# Patient Record
Sex: Female | Born: 1975 | Race: White | Hispanic: No | Marital: Single | Smoking: Former smoker
Health system: Southern US, Community
[De-identification: ages and names within clinical notes are randomized; demographics above are authoritative.]

## PROBLEM LIST (undated history)

## (undated) DIAGNOSIS — E0590 Thyrotoxicosis, unspecified without thyrotoxic crisis or storm: Secondary | ICD-10-CM

## (undated) HISTORY — PX: THYROIDECTOMY: SHX17

## (undated) HISTORY — DX: Thyrotoxicosis, unspecified without thyrotoxic crisis or storm: E05.90

---

## 2005-01-03 ENCOUNTER — Inpatient Hospital Stay (HOSPITAL_COMMUNITY): Admission: AD | Admit: 2005-01-03 | Discharge: 2005-01-05 | Payer: Self-pay | Admitting: Obstetrics

## 2013-11-25 ENCOUNTER — Emergency Department (INDEPENDENT_AMBULATORY_CARE_PROVIDER_SITE_OTHER)
Admission: EM | Admit: 2013-11-25 | Discharge: 2013-11-25 | Disposition: A | Discharge status: Home / Self Care | Payer: Self-pay | Source: Home / Self Care

## 2013-11-25 ENCOUNTER — Encounter (HOSPITAL_COMMUNITY): Payer: Self-pay | Admitting: Emergency Medicine

## 2013-11-25 DIAGNOSIS — J020 Streptococcal pharyngitis: Secondary | ICD-10-CM

## 2013-11-25 MED ORDER — CEFDINIR 300 MG PO CAPS: 300.0000 mg | ORAL_CAPSULE | Freq: Two times a day (BID) | ORAL | Status: DC

## 2013-11-25 NOTE — ED Provider Notes (Signed)
CSN: [id number]     Arrival date & time 11/25/13  1537 History   None    Chief Complaint  Patient presents with  . Sore Throat   (Consider location/radiation/quality/duration/timing/severity/associated sxs/prior Treatment) Patient is a 37 y.o. female presenting with pharyngitis. The history is provided by the patient.  Sore Throat This is a new problem. The current episode started more than 2 days ago. The problem has been gradually worsening. Associated symptoms comments: Daughter with strep last week. The symptoms are aggravated by swallowing.    Past Medical History  Diagnosis Date  . Thyroid disease    Past Surgical History  Procedure Laterality Date  . Thyroidectomy     No family history on file. History  Substance Use Topics  . Smoking status: Current Every Day Smoker -- 0.50 packs/day    Types: Cigarettes  . Smokeless tobacco: Not on file  . Alcohol Use: No   OB History   Grav Para Term Preterm Abortions TAB SAB Ect Mult Living                 Review of Systems  Constitutional: Positive for fever, chills and appetite change.  HENT: Negative for sore throat.   Cardiovascular: Negative.   Gastrointestinal: Negative.     Allergies  Review of patient's allergies indicates no known allergies.  Home Medications   Current Outpatient Rx  Name  Route  Sig  Dispense  Refill  . levothyroxine (SYNTHROID, LEVOTHROID) 200 MCG tablet   Oral   Take 200 mcg by mouth daily before breakfast.         . cefdinir (OMNICEF) 300 MG capsule   Oral   Take 1 capsule (300 mg total) by mouth 2 (two) times daily.   20 capsule   0    BP 110/80  Pulse 99  Temp(Src) 98.2 F (36.8 C) (Oral)  Resp 16  SpO2 99%  LMP 11/21/2013 Physical Exam  Nursing note and vitals reviewed. Constitutional: She is oriented to person, place, and time. She appears well-developed and well-nourished. No distress.  HENT:  Right Ear: Tympanic membrane and ear canal normal. Tympanic membrane is  not erythematous and not retracted.  Left Ear: Tympanic membrane and ear canal normal. Tympanic membrane is not erythematous and not retracted.  Mouth/Throat: Uvula is midline and mucous membranes are normal. Oropharyngeal exudate, posterior oropharyngeal edema and posterior oropharyngeal erythema present.  Eyes: Conjunctivae are normal. Pupils are equal, round, and reactive to light.  Neck: Normal range of motion. Neck supple.  Pulmonary/Chest: Breath sounds normal.  Lymphadenopathy:    She has cervical adenopathy.  Neurological: She is alert and oriented to person, place, and time.  Skin: Skin is warm and dry.    ED Course  Procedures (including critical care time) Labs Review Labs Reviewed  POCT RAPID STREP A ([facility] ONLY) - Abnormal; Notable for the following:    Streptococcus, Group A Screen (Direct) POSITIVE (*)    All other components within normal limits   Imaging Review No results found.  EKG Interpretation    Date/Time:    Ventricular Rate:    PR Interval:    QRS Duration:   QT Interval:    QTC Calculation:   R Axis:     Text Interpretation:              MDM  Strep pos.    Linna Hoff, MD 11/25/13 [phone]

## 2013-11-25 NOTE — ED Notes (Signed)
Pt c/o ST onset 4 days w/sxs that include: difficulty swallowing, bilateral ear pain, fevers Denies: v/n/d, SOB, wheezing... Taking OTC cold meds w/no relief.  Alert wn/o signs of acute distress.

## 2015-06-22 ENCOUNTER — Emergency Department (HOSPITAL_COMMUNITY): Payer: Medicaid Other

## 2015-06-22 ENCOUNTER — Emergency Department (HOSPITAL_COMMUNITY)
Admission: EM | Admit: 2015-06-22 | Discharge: 2015-06-22 | Disposition: A | Discharge status: Home / Self Care | Payer: Medicaid Other | Attending: Emergency Medicine | Admitting: Emergency Medicine

## 2015-06-22 ENCOUNTER — Encounter (HOSPITAL_COMMUNITY): Payer: Self-pay | Admitting: Emergency Medicine

## 2015-06-22 DIAGNOSIS — E079 Disorder of thyroid, unspecified: Secondary | ICD-10-CM | POA: Diagnosis not present

## 2015-06-22 DIAGNOSIS — Z79899 Other long term (current) drug therapy: Secondary | ICD-10-CM | POA: Diagnosis not present

## 2015-06-22 DIAGNOSIS — Z720 Tobacco use: Secondary | ICD-10-CM | POA: Diagnosis not present

## 2015-06-22 DIAGNOSIS — M25572 Pain in left ankle and joints of left foot: Secondary | ICD-10-CM

## 2015-06-22 MED ORDER — IBUPROFEN 800 MG PO TABS: 800.0000 mg | ORAL_TABLET | Freq: Three times a day (TID) | ORAL | Status: DC

## 2015-06-22 MED ORDER — HYDROCODONE-ACETAMINOPHEN 5-325 MG PO TABS: 2.0000 | ORAL_TABLET | ORAL | Status: DC | PRN

## 2015-06-22 NOTE — ED Provider Notes (Signed)
CSN: [id number]     Arrival date & time 06/22/15  2118 History  This chart was scribed for Lori Berry, PA-C, working with Arby Barrette, MD by Chestine Spore, ED Scribe. The patient was seen in room TR11C/TR11C at 9:28 PM.    Chief Complaint  Patient presents with  . Ankle Pain      The history is provided by the patient. No language interpreter was used.    HPI Comments: Lori Hanson is a 39 y.o. female who presents to the Emergency Department complaining of worsening left ankle pain onset 1.5 months ago, after tripping on a slight elevation change in her friend's driveway, which caused her to roll her left ankle and fall on her face.  She was able to bare partial weight at the time of the incident, she states it was black and blue and swollen..   Pt notes that she has not been able to sleep at night due to the pain. Pt describes her left ankle pain as razor sharp pain, made worse with movement or baring weight.  She has been able to get around by putting her weight on her heel and inside edge of her foot.  Walking down stairs at here home greatly exacerbates her pain.  Pt initially thought that she sprained her ankle and that is why she didn't come in to be seen. . She states that she is having associated symptoms of left ankle swelling and bruising that has since resolved. She states that she has tried RICE with OTC tylenol with no relief for her symptoms. She denies left calf tenderness, left knee pain, left hip pain, and any other symptoms. Pt denies having an orthopedist that she sees. Pt PCP is Dr. Yetta Barre at [facility]. Pt denies allergies to medications.     Past Medical History  Diagnosis Date  . Thyroid disease    Past Surgical History  Procedure Laterality Date  . Thyroidectomy     No family history on file. History  Substance Use Topics  . Smoking status: Current Every Day Smoker -- 0.00 packs/day    Types: Cigarettes  . Smokeless tobacco: Not on file  . Alcohol  Use: No   OB History    No data available     Review of Systems  Musculoskeletal: Positive for joint swelling and arthralgias. Negative for gait problem.  Skin: Negative for color change, rash and wound.      Allergies  Review of patient's allergies indicates no known allergies.  Home Medications   Prior to Admission medications   Medication Sig Start Date End Date Taking? Authorizing Provider  cefdinir (OMNICEF) 300 MG capsule Take 1 capsule (300 mg total) by mouth 2 (two) times daily. 11/25/13   Linna Hoff, MD  levothyroxine (SYNTHROID, LEVOTHROID) 200 MCG tablet Take 200 mcg by mouth daily before breakfast.    Historical Provider, MD   BP 122/81 mmHg  Pulse 91  Temp(Src) 99.1 F (37.3 C) (Oral)  Resp 18  Wt 234 lb 12.8 oz (106.505 kg)  SpO2 98%  LMP 06/22/2015 Physical Exam  Constitutional: She is oriented to person, place, and time. She appears well-developed and well-nourished. No distress.  HENT:  Head: Normocephalic and atraumatic.  Right Ear: External ear normal.  Left Ear: External ear normal.  Nose: Nose normal.  Mouth/Throat: Oropharynx is clear and moist. No oropharyngeal exudate.  Eyes: Conjunctivae and EOM are normal. Pupils are equal, round, and reactive to light. Right eye exhibits no  discharge. Left eye exhibits no discharge. No scleral icterus.  Neck: Normal range of motion. Neck supple. No JVD present. No tracheal deviation present. No thyromegaly present.  Cardiovascular: Normal rate and regular rhythm.   Pulmonary/Chest: Effort normal and breath sounds normal. No stridor. No respiratory distress.  Musculoskeletal: Normal range of motion.  Left ankle lateral malleolus tender to palpation with swelling no edema no induration no ecchymosis Normal range of motion of left ankle, tenderness with left ankle flexion and eversion inversion, normal left knee range of motion, antalgic gait Neurovascularly intact bilateral lower extremities    Lymphadenopathy:    She has no cervical adenopathy.  Neurological: She is alert and oriented to person, place, and time. No cranial nerve deficit. She exhibits normal muscle tone. Coordination normal.  Skin: Skin is warm and dry. No rash noted. She is not diaphoretic. No erythema. No pallor.  Psychiatric: She has a normal mood and affect. Her behavior is normal. Judgment and thought content normal.  Nursing note and vitals reviewed.   ED Course  Procedures (including critical care time) DIAGNOSTIC STUDIES: Oxygen Saturation is 98% on RA, nl by my interpretation.    COORDINATION OF CARE: 9:35 PM-Discussed treatment plan with pt at bedside and pt agreed to plan.   Labs Review Labs Reviewed - No data to display  Imaging Review Dg Ankle Complete Left  06/22/2015   CLINICAL DATA:  Initial valuation for acute left ankle pain. Fall 1 month ago.  EXAM: LEFT ANKLE COMPLETE - 3+ VIEW  COMPARISON:  None.  FINDINGS: No acute fracture or dislocation. Talar dome intact. No joint effusion. Ankle mortise approximated. No acute soft tissue abnormality.  Small degenerative plantar calcaneal enthesophyte noted.  IMPRESSION: No acute abnormality identified about the left ankle.   Electronically Signed   By: Rise MuBenjamin  McClintock M.D.   On: 06/22/2015 22:01    EKG Interpretation None      MDM   Final diagnoses:  None    Pt with left ankle injury 1.5 months ago, with residual pain and swelling. X-ray negative for fx, visible swelling, bony tenderness - air splint applied, RICE tx, NSAID, and ortho f/u  I personally performed the services described in this documentation, which was scribed in my presence. The recorded information has been reviewed and is accurate.    Lori BerryLeisa Ashten Prats, PA-C 06/22/15 2320  Arby BarretteMarcy Pfeiffer, MD 06/24/15 2010

## 2015-06-22 NOTE — Discharge Instructions (Signed)

## 2015-06-22 NOTE — ED Notes (Signed)
Pt. fell 1 1/2 months ago and injured her left ankle , reports pain and swelling at left ankle , ambulatory.

## 2015-06-22 NOTE — Progress Notes (Signed)
Orthopedic Tech Progress Note Patient Details:  Lori Hanson 08-May-1976 [id number]  Ortho Devices Type of Ortho Device: Ankle Air splint, Crutches Ortho Device/Splint Location: LLE Ortho Device/Splint Interventions: Ordered, Application   Jennye MoccasinHughes, Deitra Craine Craig 06/22/2015, 10:44 PM

## 2015-07-13 ENCOUNTER — Telehealth: Payer: Self-pay | Admitting: *Deleted

## 2015-07-13 NOTE — Telephone Encounter (Signed)
Patient is interested in a possible IUD removal.  Attempted to contact the patient and left message for patient to contact the office.

## 2015-07-21 NOTE — Telephone Encounter (Signed)
Patient returned call to the office. Patient states okay to leave a message. Attempted to return the patient's call. Left message requesting patient to return call leaving information on what time of day or day of the week works for her as well when her IUD was placed and if she may want another. Will schedule appointment after receiving information.

## 2015-07-22 NOTE — Telephone Encounter (Signed)
Spoke with patient and scheduled her 07-27-15 @ 9:30 am for a NP/ IUD removal.

## 2015-07-27 ENCOUNTER — Encounter: Payer: Self-pay | Admitting: Certified Nurse Midwife

## 2015-07-27 ENCOUNTER — Ambulatory Visit (INDEPENDENT_AMBULATORY_CARE_PROVIDER_SITE_OTHER): Payer: Medicaid Other | Admitting: Certified Nurse Midwife

## 2015-07-27 VITALS — BP 128/85 | HR 82 | Temp 98.2°F | Wt 231.0 lb

## 2015-07-27 DIAGNOSIS — A630 Anogenital (venereal) warts: Secondary | ICD-10-CM | POA: Diagnosis not present

## 2015-07-27 DIAGNOSIS — Z3169 Encounter for other general counseling and advice on procreation: Secondary | ICD-10-CM | POA: Diagnosis not present

## 2015-07-27 MED ORDER — SINECATECHINS 15 % EX OINT: 1.0000 "application " | TOPICAL_OINTMENT | Freq: Two times a day (BID) | CUTANEOUS | Status: AC

## 2015-07-27 MED ORDER — VITAFOL ULTRA 29-0.6-0.4-200 MG PO CAPS: 1.0000 | ORAL_CAPSULE | Freq: Every day | ORAL | Status: DC

## 2015-07-27 NOTE — Progress Notes (Signed)
Patient ID: Lori Hanson, female   DOB: 12/16/1975, 39 y.o.   MRN: [id number]  No chief complaint on file.   HPI Lori Hanson is a 39 y.o. female.  Here for Mirena IUD removal, desires pregnancy.  Chronic medical condition: hypothyroidism had thyroid removed roughly 7 years ago, denies any radiation/chemo exposure.    HPI  Past Medical History  Diagnosis Date  . Thyroid disease     Past Surgical History  Procedure Laterality Date  . Thyroidectomy      No family history on file.  Social History Social History  Substance Use Topics  . Smoking status: Current Every Day Smoker -- 0.00 packs/day    Types: Cigarettes  . Smokeless tobacco: Not on file  . Alcohol Use: No    No Known Allergies  Current Outpatient Prescriptions  Medication Sig Dispense Refill  . cefdinir (OMNICEF) 300 MG capsule Take 1 capsule (300 mg total) by mouth 2 (two) times daily. 20 capsule 0  . HYDROcodone-acetaminophen (NORCO/VICODIN) 5-325 MG per tablet Take 2 tablets by mouth every 4 (four) hours as needed. 6 tablet 0  . ibuprofen (ADVIL,MOTRIN) 800 MG tablet Take 1 tablet (800 mg total) by mouth 3 (three) times daily. 21 tablet 0  . levothyroxine (SYNTHROID, LEVOTHROID) 200 MCG tablet Take 200 mcg by mouth daily before breakfast.    . Prenat-Fe Poly-Methfol-FA-DHA (VITAFOL ULTRA) 29-0.6-0.4-200 MG CAPS Take 1 tablet by mouth daily. 30 capsule 12  . Sinecatechins (VEREGEN) 15 % OINT Apply 1 application topically 2 (two) times daily. 30 g 0   No current facility-administered medications for this visit.    Review of Systems Review of Systems Constitutional: negative for fatigue and weight loss Respiratory: negative for cough and wheezing Cardiovascular: negative for chest pain, fatigue and palpitations Gastrointestinal: negative for abdominal pain and change in bowel habits Genitourinary:negative Integument/breast: negative for nipple discharge Musculoskeletal:negative for  myalgias Neurological: negative for gait problems and tremors Behavioral/Psych: negative for abusive relationship, depression Endocrine: negative for temperature intolerance     There were no vitals taken for this visit.  Physical Exam Physical Exam General:   alert  Skin:   no rash or abnormalities  Lungs:   clear to auscultation bilaterally  Heart:   regular rate and rhythm, S1, S2 normal, no murmur, click, rub or gallop  Breasts:   deferred  Abdomen:  normal findings: no organomegaly, soft, non-tender and no hernia  Pelvis:  External genitalia: normal general appearance, + genital warts on labia and rectum Urinary system: urethral meatus normal and bladder without fullness, nontender Vaginal: normal without tenderness, induration or masses Cervix: normal appearance Adnexa: normal bimanual exam Uterus: anteverted and non-tender, normal size    50% of 15 min visit spent on counseling and coordination of care.   Data Reviewed Previous medical hx, labs, meds  Assessment    IUD removed intact Genital warts  Preconception counseling  Plan    No orders of the defined types were placed in this encounter.   Meds ordered this encounter  Medications  . Sinecatechins (VEREGEN) 15 % OINT    Sig: Apply 1 application topically 2 (two) times daily.    Dispense:  30 g    Refill:  0  . Prenat-Fe Poly-Methfol-FA-DHA (VITAFOL ULTRA) 29-0.6-0.4-200 MG CAPS    Sig: Take 1 tablet by mouth daily.    Dispense:  30 capsule    Refill:  12     Follow up in a few weeks with annual exam.

## 2015-07-27 NOTE — Addendum Note (Signed)
Addended by: Clearnce Hasten on: 07/27/2015 11:46 AM   Modules accepted: Medications

## 2015-08-10 ENCOUNTER — Ambulatory Visit: Payer: Medicaid Other | Admitting: Certified Nurse Midwife

## 2015-11-16 ENCOUNTER — Encounter: Payer: Self-pay | Admitting: Obstetrics

## 2015-11-16 ENCOUNTER — Ambulatory Visit (INDEPENDENT_AMBULATORY_CARE_PROVIDER_SITE_OTHER): Payer: Medicaid Other | Admitting: Obstetrics

## 2015-11-16 VITALS — BP 113/77 | HR 97 | Temp 98.9°F | Ht 67.0 in | Wt 222.0 lb

## 2015-11-16 DIAGNOSIS — Z3482 Encounter for supervision of other normal pregnancy, second trimester: Secondary | ICD-10-CM | POA: Diagnosis not present

## 2015-11-16 DIAGNOSIS — Z3201 Encounter for pregnancy test, result positive: Secondary | ICD-10-CM

## 2015-11-16 DIAGNOSIS — O09522 Supervision of elderly multigravida, second trimester: Secondary | ICD-10-CM

## 2015-11-16 DIAGNOSIS — A630 Anogenital (venereal) warts: Secondary | ICD-10-CM | POA: Diagnosis not present

## 2015-11-16 LAB — POCT URINALYSIS DIPSTICK
BILIRUBIN UA: NEGATIVE
Blood, UA: NEGATIVE
Glucose, UA: NEGATIVE
LEUKOCYTES UA: NEGATIVE
NITRITE UA: NEGATIVE
PROTEIN UA: NEGATIVE
Spec Grav, UA: 1.015
Urobilinogen, UA: NEGATIVE
pH, UA: 5

## 2015-11-16 LAB — TSH: TSH: 1.771 u[IU]/mL (ref 0.350–4.500)

## 2015-11-16 LAB — POCT URINE PREGNANCY: Preg Test, Ur: POSITIVE — AB

## 2015-11-16 NOTE — Progress Notes (Signed)
Subjective:    Lori Hanson is being seen today for her first obstetrical visit.  This is a planned pregnancy. She is at 7233w3d gestation. Her obstetrical history is significant for advanced maternal age. Relationship with FOB: significant other, living together. Patient does intend to breast feed. Pregnancy history fully reviewed.  The information documented in the HPI was reviewed and verified.  Menstrual History: OB History    Gravida Para Term Preterm AB TAB SAB Ectopic Multiple Living   6 3 3  2  1   3        Patient's last menstrual period was 08/14/2015.    Past Medical History  Diagnosis Date  . Thyroid disease   . Hyperthyroidism     Past Surgical History  Procedure Laterality Date  . Thyroidectomy       (Not in a hospital admission) No Known Allergies  Social History  Substance Use Topics  . Smoking status: Former Smoker -- 0.00 packs/day    Types: Cigarettes  . Smokeless tobacco: Not on file  . Alcohol Use: No    Family History  Problem Relation Age of Onset  . Adopted: Yes     Review of Systems Constitutional: negative for weight loss Gastrointestinal: negative for vomiting Genitourinary:negative for genital lesions and vaginal discharge and dysuria Musculoskeletal:negative for back pain Behavioral/Psych: negative for abusive relationship, depression, illegal drug usage and tobacco use    Objective:    BP 113/77 mmHg  Pulse 97  Temp(Src) 98.9 F (37.2 C)  Ht 5\' 7"  (1.702 m)  Wt 222 lb (100.699 kg)  BMI 34.76 kg/m2  LMP 08/14/2015 General Appearance:    Alert, cooperative, no distress, appears stated age  Head:    Normocephalic, without obvious abnormality, atraumatic  Eyes:    PERRL, conjunctiva/corneas clear, EOM's intact, fundi    benign, both eyes  Ears:    Normal TM's and external ear canals, both ears  Nose:   Nares normal, septum midline, mucosa normal, no drainage    or sinus tenderness  Throat:   Lips, mucosa, and tongue normal;  teeth and gums normal  Neck:   Supple, symmetrical, trachea midline, no adenopathy;    thyroid:  no enlargement/tenderness/nodules; no carotid   bruit or JVD  Back:     Symmetric, no curvature, ROM normal, no CVA tenderness  Lungs:     Clear to auscultation bilaterally, respirations unlabored  Chest Wall:    No tenderness or deformity   Heart:    Regular rate and rhythm, S1 and S2 normal, no murmur, rub   or gallop  Breast Exam:    No tenderness, masses, or nipple abnormality  Abdomen:     Soft, non-tender, bowel sounds active all four quadrants,    no masses, no organomegaly  Genitalia:    Perianal and perineal condyloma; otherwise, normal female without lesion, discharge or tenderness  Extremities:   Extremities normal, atraumatic, no cyanosis or edema  Pulses:   2+ and symmetric all extremities  Skin:   Skin color, texture, turgor normal, no rashes or lesions  Lymph nodes:   Cervical, supraclavicular, and axillary nodes normal  Neurologic:   CNII-XII intact, normal strength, sensation and reflexes    throughout      Lab Review Urine pregnancy test Labs reviewed no Radiologic studies reviewed no Assessment:    Pregnancy at 3733w3d weeks    Plan:     Referred to MFM for AMA.  Prenatal vitamins.  Counseling provided regarding continued use of  seat belts, cessation of alcohol consumption, smoking or use of illicit drugs; infection precautions i.e., influenza/TDAP immunizations, toxoplasmosis,CMV, parvovirus, listeria and varicella; workplace safety, exercise during pregnancy; routine dental care, safe medications, sexual activity, hot tubs, saunas, pools, travel, caffeine use, fish and methlymercury, potential toxins, hair treatments, varicose veins Weight gain recommendations per IOM guidelines reviewed: underweight/BMI< 18.5--> gain 28 - 40 lbs; normal weight/BMI 18.5 - 24.9--> gain 25 - 35 lbs; overweight/BMI 25 - 29.9--> gain 15 - 25 lbs; obese/BMI >30->gain  11 - 20 lbs Problem  list reviewed and updated. FIRST/CF mutation testing/NIPT/QUAD SCREEN/fragile X/Ashkenazi Jewish population testing/Spinal muscular atrophy discussed: requested. Role of ultrasound in pregnancy discussed; fetal survey: requested. Amniocentesis discussed: undecided. VBAC calculator score: VBAC consent form provided No orders of the defined types were placed in this encounter.   Orders Placed This Encounter  Procedures  . Culture, OB Urine  . SureSwab, Vaginosis/Vaginitis Plus  . [place] MFM Fetal Nuchal Translucency    Standing Status: Future     Number of Occurrences:      Standing Expiration Date: 01/16/2017    Order Specific Question:  Reason for Exam (SYMPTOM  OR DIAGNOSIS REQUIRED)    Answer:  AMA    Order Specific Question:  Preferred imaging location?    Answer:  [facility]    Order Specific Question:  Call Results- Best Contact Number?    Answer:  AMA  . Obstetric panel  . HIV antibody  . Varicella zoster antibody, IgG  . VITAMIN D 25 Hydroxy (Vit-D Deficiency, Fractures)  . TSH  . AMB Referral to Maternal Fetal Medicine (MFM)    Referral Priority:  Routine    Referral Type:  Consultation    Number of Visits Requested:  1  . POCT urinalysis dipstick  . POCT urine pregnancy    Follow up in 4 weeks.

## 2015-11-17 LAB — OBSTETRIC PANEL
Antibody Screen: NEGATIVE
Basophils Absolute: 0 10*3/uL (ref 0.0–0.1)
Basophils Relative: 0 % (ref 0–1)
EOS PCT: 2 % (ref 0–5)
Eosinophils Absolute: 0.2 10*3/uL (ref 0.0–0.7)
HCT: 40.9 % (ref 36.0–46.0)
HEP B S AG: NEGATIVE
Hemoglobin: 13.8 g/dL (ref 12.0–15.0)
LYMPHS PCT: 28 % (ref 12–46)
Lymphs Abs: 2.7 10*3/uL (ref 0.7–4.0)
MCH: 32.3 pg (ref 26.0–34.0)
MCHC: 33.7 g/dL (ref 30.0–36.0)
MCV: 93.4 fL (ref 78.0–100.0)
MONO ABS: 0.5 10*3/uL (ref 0.1–1.0)
MPV: 9.5 fL (ref 8.6–12.4)
Monocytes Relative: 5 % (ref 3–12)
Neutro Abs: 6.3 10*3/uL (ref 1.7–7.7)
Neutrophils Relative %: 65 % (ref 43–77)
PLATELETS: 223 10*3/uL (ref 150–400)
RBC: 4.27 MIL/uL (ref 3.87–5.11)
RDW: 13 % (ref 11.5–15.5)
RH TYPE: POSITIVE
Rubella: 2.53 Index — ABNORMAL HIGH (ref <0.90)
WBC: 9.7 10*3/uL (ref 4.0–10.5)

## 2015-11-17 LAB — CULTURE, OB URINE
Colony Count: NO GROWTH
Organism ID, Bacteria: NO GROWTH

## 2015-11-17 LAB — VITAMIN D 25 HYDROXY (VIT D DEFICIENCY, FRACTURES): Vit D, 25-Hydroxy: 13 ng/mL — ABNORMAL LOW (ref 30–100)

## 2015-11-17 LAB — HIV ANTIBODY (ROUTINE TESTING W REFLEX): HIV: NONREACTIVE

## 2015-11-17 LAB — VARICELLA ZOSTER ANTIBODY, IGG: Varicella IgG: 1831 Index — ABNORMAL HIGH (ref <135.00)

## 2015-11-18 ENCOUNTER — Ambulatory Visit (HOSPITAL_COMMUNITY): Admission: RE | Admit: 2015-11-18 | Payer: Medicaid Other | Source: Ambulatory Visit

## 2015-11-18 ENCOUNTER — Encounter (HOSPITAL_COMMUNITY): Payer: Self-pay

## 2015-11-18 ENCOUNTER — Ambulatory Visit (HOSPITAL_COMMUNITY)
Admission: RE | Admit: 2015-11-18 | Discharge: 2015-11-18 | Disposition: A | Discharge status: Home / Self Care | Payer: Medicaid Other | Source: Ambulatory Visit | Attending: Obstetrics | Admitting: Obstetrics

## 2015-11-18 DIAGNOSIS — Z36 Encounter for antenatal screening of mother: Secondary | ICD-10-CM | POA: Diagnosis not present

## 2015-11-18 DIAGNOSIS — O09522 Supervision of elderly multigravida, second trimester: Secondary | ICD-10-CM

## 2015-11-18 DIAGNOSIS — O09521 Supervision of elderly multigravida, first trimester: Secondary | ICD-10-CM | POA: Diagnosis present

## 2015-11-18 DIAGNOSIS — Z3A13 13 weeks gestation of pregnancy: Secondary | ICD-10-CM | POA: Diagnosis not present

## 2015-11-18 LAB — PAP, TP IMAGING W/ HPV RNA, RFLX HPV TYPE 16,18/45: HPV mRNA, High Risk: NOT DETECTED

## 2015-11-20 LAB — SURESWAB, VAGINOSIS/VAGINITIS PLUS
ATOPOBIUM VAGINAE: 7 Log (cells/mL)
C. PARAPSILOSIS, DNA: DETECTED — AB
C. albicans, DNA: NOT DETECTED
C. glabrata, DNA: NOT DETECTED
C. trachomatis RNA, TMA: NOT DETECTED
C. tropicalis, DNA: NOT DETECTED
Gardnerella vaginalis: >8 Log (cells/mL)
LACTOBACILLUS SPECIES: NOT DETECTED Log (cells/mL)
MEGASPHAERA SPECIES: 7.6 Log (cells/mL)
N. GONORRHOEAE RNA, TMA: NOT DETECTED
T. vaginalis RNA, QL TMA: NOT DETECTED

## 2015-11-21 ENCOUNTER — Other Ambulatory Visit: Payer: Self-pay | Admitting: Obstetrics

## 2015-11-21 DIAGNOSIS — B3731 Acute candidiasis of vulva and vagina: Secondary | ICD-10-CM

## 2015-11-21 DIAGNOSIS — B9689 Other specified bacterial agents as the cause of diseases classified elsewhere: Secondary | ICD-10-CM

## 2015-11-21 DIAGNOSIS — N760 Acute vaginitis: Principal | ICD-10-CM

## 2015-11-21 DIAGNOSIS — B373 Candidiasis of vulva and vagina: Secondary | ICD-10-CM

## 2015-11-21 MED ORDER — METRONIDAZOLE 500 MG PO TABS: 500.0000 mg | ORAL_TABLET | Freq: Two times a day (BID) | ORAL | Status: DC

## 2015-11-21 MED ORDER — FLUCONAZOLE 150 MG PO TABS: 150.0000 mg | ORAL_TABLET | Freq: Once | ORAL | Status: DC

## 2015-12-12 ENCOUNTER — Encounter: Payer: Medicaid Other | Admitting: Obstetrics

## 2015-12-12 ENCOUNTER — Telehealth: Payer: Self-pay | Admitting: Obstetrics

## 2015-12-15 ENCOUNTER — Ambulatory Visit (INDEPENDENT_AMBULATORY_CARE_PROVIDER_SITE_OTHER): Payer: Medicaid Other | Admitting: Obstetrics

## 2015-12-15 ENCOUNTER — Encounter: Payer: Self-pay | Admitting: Obstetrics

## 2015-12-15 VITALS — BP 114/77 | HR 110 | Wt 220.0 lb

## 2015-12-15 DIAGNOSIS — Z3482 Encounter for supervision of other normal pregnancy, second trimester: Secondary | ICD-10-CM

## 2015-12-15 DIAGNOSIS — O09522 Supervision of elderly multigravida, second trimester: Secondary | ICD-10-CM

## 2015-12-15 LAB — POCT URINALYSIS DIPSTICK
Bilirubin, UA: NEGATIVE
Blood, UA: NEGATIVE
Glucose, UA: NEGATIVE
KETONES UA: NEGATIVE
LEUKOCYTES UA: NEGATIVE
Nitrite, UA: NEGATIVE
PROTEIN UA: NEGATIVE
SPEC GRAV UA: 1.025
Urobilinogen, UA: NEGATIVE
pH, UA: 5

## 2015-12-15 NOTE — Progress Notes (Signed)
  Subjective:    Lori Hanson is a 40 y.o. female being seen today for her obstetrical visit. She is at 377w4d gestation. Patient reports: no complaints.  Problem List Items Addressed This Visit    None    Visit Diagnoses    Encounter for supervision of other normal pregnancy in second trimester    -  Primary    Relevant Orders    POCT urinalysis dipstick (Completed)      There are no active problems to display for this patient.   Objective:     BP 114/77 mmHg  Pulse 110  Wt 220 lb (99.791 kg)  LMP 08/14/2015 Uterine Size: Below umbilicus     Assessment:    Pregnancy @ 527w4d  weeks Doing well    Plan:    Problem list reviewed and updated. Labs reviewed.  Follow up in 4 weeks. FIRST/CF mutation testing/NIPT/QUAD SCREEN/fragile X/Ashkenazi Jewish population testing/Spinal muscular atrophy discussed: requested. Role of ultrasound in pregnancy discussed; fetal survey: requested. Amniocentesis discussed: not indicated.

## 2015-12-21 ENCOUNTER — Other Ambulatory Visit: Payer: Self-pay | Admitting: Certified Nurse Midwife

## 2015-12-26 ENCOUNTER — Encounter (HOSPITAL_COMMUNITY): Payer: Self-pay

## 2015-12-26 ENCOUNTER — Ambulatory Visit (HOSPITAL_COMMUNITY)
Admission: RE | Admit: 2015-12-26 | Discharge: 2015-12-26 | Disposition: A | Discharge status: Home / Self Care | Payer: Medicaid Other | Source: Ambulatory Visit | Attending: Obstetrics | Admitting: Obstetrics

## 2015-12-26 DIAGNOSIS — Z36 Encounter for antenatal screening of mother: Secondary | ICD-10-CM | POA: Insufficient documentation

## 2015-12-26 DIAGNOSIS — E0590 Thyrotoxicosis, unspecified without thyrotoxic crisis or storm: Secondary | ICD-10-CM | POA: Diagnosis not present

## 2015-12-26 DIAGNOSIS — O99282 Endocrine, nutritional and metabolic diseases complicating pregnancy, second trimester: Secondary | ICD-10-CM | POA: Diagnosis not present

## 2015-12-26 DIAGNOSIS — O09522 Supervision of elderly multigravida, second trimester: Secondary | ICD-10-CM | POA: Insufficient documentation

## 2015-12-26 DIAGNOSIS — O99212 Obesity complicating pregnancy, second trimester: Secondary | ICD-10-CM | POA: Insufficient documentation

## 2015-12-26 DIAGNOSIS — Z3A19 19 weeks gestation of pregnancy: Secondary | ICD-10-CM | POA: Diagnosis not present

## 2015-12-27 NOTE — Telephone Encounter (Signed)
Patient appt rescheduled.

## 2016-01-16 ENCOUNTER — Ambulatory Visit (INDEPENDENT_AMBULATORY_CARE_PROVIDER_SITE_OTHER): Payer: Medicaid Other | Admitting: Obstetrics

## 2016-01-16 VITALS — BP 120/73 | HR 96 | Temp 99.0°F | Wt 217.6 lb

## 2016-01-16 DIAGNOSIS — K219 Gastro-esophageal reflux disease without esophagitis: Secondary | ICD-10-CM

## 2016-01-16 DIAGNOSIS — Z3492 Encounter for supervision of normal pregnancy, unspecified, second trimester: Secondary | ICD-10-CM

## 2016-01-16 LAB — POCT URINALYSIS DIPSTICK
Bilirubin, UA: NEGATIVE
Blood, UA: NEGATIVE
Glucose, UA: NEGATIVE
KETONES UA: 2
LEUKOCYTES UA: NEGATIVE
Nitrite, UA: NEGATIVE
PH UA: 6
Spec Grav, UA: 1.015
UROBILINOGEN UA: NEGATIVE

## 2016-01-16 MED ORDER — OMEPRAZOLE 20 MG PO CPDR: 20.0000 mg | DELAYED_RELEASE_CAPSULE | Freq: Two times a day (BID) | ORAL | Status: DC

## 2016-01-16 NOTE — Progress Notes (Signed)
Patient reports she is doing well with no concerns today. 

## 2016-01-18 ENCOUNTER — Encounter: Payer: Self-pay | Admitting: Obstetrics

## 2016-01-18 NOTE — Progress Notes (Signed)
Subjective:    Lori Hanson is a 40 y.o. female being seen today for her obstetrical visit. She is at [redacted]w[redacted]d gestation. Patient reports: heartburn . Fetal movement: normal.  Problem List Items Addressed This Visit    None    Visit Diagnoses    Prenatal care, second trimester    -  Primary    Relevant Orders    POCT urinalysis dipstick (Completed)    GERD without esophagitis        Relevant Medications    omeprazole (PRILOSEC) 20 MG capsule      There are no active problems to display for this patient.  Objective:    BP 120/73 mmHg  Pulse 96  Temp(Src) 99 F (37.2 C)  Wt 217 lb 9.6 oz (98.703 kg)  LMP 08/14/2015 FHT: 150 BPM  Uterine Size: size equals dates     Assessment:    Pregnancy @ [redacted]w[redacted]d     Heartburn  Plan:    Signs and symptoms of preterm labor: discussed. Zantac Rx Labs, problem list reviewed and updated 2 hr GTT planned Follow up in 4 weeks.

## 2016-02-13 ENCOUNTER — Ambulatory Visit (INDEPENDENT_AMBULATORY_CARE_PROVIDER_SITE_OTHER): Payer: Medicaid Other | Admitting: Obstetrics

## 2016-02-13 VITALS — BP 108/77 | HR 112 | Temp 98.4°F | Wt 219.0 lb

## 2016-02-13 DIAGNOSIS — Z3483 Encounter for supervision of other normal pregnancy, third trimester: Secondary | ICD-10-CM

## 2016-02-13 LAB — POCT URINALYSIS DIPSTICK
Bilirubin, UA: NEGATIVE
Blood, UA: NEGATIVE
Glucose, UA: NEGATIVE
Leukocytes, UA: NEGATIVE
Nitrite, UA: NEGATIVE
PROTEIN UA: NEGATIVE
SPEC GRAV UA: 1.015
UROBILINOGEN UA: NEGATIVE
pH, UA: 5

## 2016-02-14 ENCOUNTER — Encounter: Payer: Self-pay | Admitting: Obstetrics

## 2016-02-14 NOTE — Progress Notes (Signed)
Subjective:    Lori Hanson is a 40 y.o. female being seen today for her obstetrical visit. She is at 5692w2d gestation. Patient reports: no complaints . Fetal movement: normal.  Problem List Items Addressed This Visit    None    Visit Diagnoses    Supervision of other normal pregnancy, antepartum, second trimester    -  Primary    Relevant Orders    POCT urinalysis dipstick (Completed)      There are no active problems to display for this patient.  Objective:    BP 108/77 mmHg  Pulse 112  Temp(Src) 98.4 F (36.9 C)  Wt 219 lb (99.338 kg)  LMP 08/14/2015 FHT: 150 BPM  Uterine Size: size equals dates     Assessment:    Pregnancy @ 192w2d    Plan:    OBGCT: ordered. Signs and symptoms of preterm labor: discussed.  Labs, problem list reviewed and updated 2 hr GTT planned Follow up in 2 weeks.

## 2016-02-27 ENCOUNTER — Ambulatory Visit (INDEPENDENT_AMBULATORY_CARE_PROVIDER_SITE_OTHER): Payer: Medicaid Other | Admitting: Obstetrics

## 2016-02-27 ENCOUNTER — Encounter (HOSPITAL_COMMUNITY): Payer: Self-pay | Admitting: *Deleted

## 2016-02-27 ENCOUNTER — Other Ambulatory Visit: Payer: Self-pay | Admitting: Certified Nurse Midwife

## 2016-02-27 ENCOUNTER — Other Ambulatory Visit: Payer: Medicaid Other

## 2016-02-27 ENCOUNTER — Inpatient Hospital Stay (HOSPITAL_COMMUNITY)
Admission: AD | Admit: 2016-02-27 | Discharge: 2016-02-27 | Disposition: A | Discharge status: Home / Self Care | Payer: Medicaid Other | Source: Ambulatory Visit | Attending: Obstetrics | Admitting: Obstetrics

## 2016-02-27 VITALS — BP 117/79 | HR 101 | Temp 98.0°F | Wt 217.6 lb

## 2016-02-27 DIAGNOSIS — R109 Unspecified abdominal pain: Secondary | ICD-10-CM | POA: Insufficient documentation

## 2016-02-27 DIAGNOSIS — O99283 Endocrine, nutritional and metabolic diseases complicating pregnancy, third trimester: Secondary | ICD-10-CM | POA: Diagnosis not present

## 2016-02-27 DIAGNOSIS — O26893 Other specified pregnancy related conditions, third trimester: Secondary | ICD-10-CM | POA: Insufficient documentation

## 2016-02-27 DIAGNOSIS — Z87891 Personal history of nicotine dependence: Secondary | ICD-10-CM | POA: Diagnosis not present

## 2016-02-27 DIAGNOSIS — Z3A28 28 weeks gestation of pregnancy: Secondary | ICD-10-CM | POA: Diagnosis not present

## 2016-02-27 DIAGNOSIS — E860 Dehydration: Secondary | ICD-10-CM

## 2016-02-27 DIAGNOSIS — O9989 Other specified diseases and conditions complicating pregnancy, childbirth and the puerperium: Secondary | ICD-10-CM | POA: Diagnosis not present

## 2016-02-27 DIAGNOSIS — Z79899 Other long term (current) drug therapy: Secondary | ICD-10-CM | POA: Insufficient documentation

## 2016-02-27 DIAGNOSIS — Z3493 Encounter for supervision of normal pregnancy, unspecified, third trimester: Secondary | ICD-10-CM

## 2016-02-27 DIAGNOSIS — E890 Postprocedural hypothyroidism: Secondary | ICD-10-CM | POA: Insufficient documentation

## 2016-02-27 DIAGNOSIS — O26899 Other specified pregnancy related conditions, unspecified trimester: Secondary | ICD-10-CM

## 2016-02-27 LAB — POCT URINALYSIS DIPSTICK
Bilirubin, UA: NEGATIVE
GLUCOSE UA: NEGATIVE
KETONES UA: 2
Nitrite, UA: NEGATIVE
Protein, UA: 2
RBC UA: NEGATIVE
SPEC GRAV UA: 1.025
Urobilinogen, UA: NEGATIVE
pH, UA: 6

## 2016-02-27 LAB — URINALYSIS, ROUTINE W REFLEX MICROSCOPIC
Bilirubin Urine: NEGATIVE
GLUCOSE, UA: NEGATIVE mg/dL
Hgb urine dipstick: NEGATIVE
Ketones, ur: 40 mg/dL — AB
Leukocytes, UA: NEGATIVE
Nitrite: NEGATIVE
Protein, ur: NEGATIVE mg/dL
SPECIFIC GRAVITY, URINE: 1.02 (ref 1.005–1.030)
pH: 6 (ref 5.0–8.0)

## 2016-02-27 MED ORDER — DEXTROSE 5 % IN LACTATED RINGERS IV BOLUS
1000.0000 mL | Freq: Once | INTRAVENOUS | Status: AC
  Administered 2016-02-27: 1000 mL via INTRAVENOUS

## 2016-02-27 NOTE — MAU Note (Signed)
Sent from OB's office for IV fluids; states that she is dehydrated; having abdominal pain but ucs were ruled out at the OB's office;

## 2016-02-27 NOTE — Discharge Instructions (Signed)

## 2016-02-27 NOTE — MAU Provider Note (Signed)
History     CSN: [id number]  Arrival date and time: 02/27/16 1201   First Provider Initiated Contact with Patient 02/27/16 1247       Chief Complaint  Patient presents with  . Abdominal Pain  . Dehydration   HPI Lori Hanson is a 40 y.o. [id number] at 3763w1d who presents sent from office for IV fluids. Went to office today for glucose tolerance test; told nurse that she was having abdominal cramping so they did NST. No contractions on the monitor but told patient she needed to come to MAU for IV fluids d/t dehydration. Patient states abdominal cramping has been ongoing for several months & hasn't really changed today. Denies contractions, vaginal bleeding, or LOF. Positive fetal movement. Pt states she's only had 2 cups of water today.   OB History    Gravida Para Term Preterm AB TAB SAB Ectopic Multiple Living   6 3 3  2  1   3       Past Medical History  Diagnosis Date  . Hyperthyroidism     Past Surgical History  Procedure Laterality Date  . Thyroidectomy      Family History  Problem Relation Age of Onset  . Adopted: Yes  . Family history unknown: Yes    Social History  Substance Use Topics  . Smoking status: Former Smoker -- 0.00 packs/day    Types: Cigarettes  . Smokeless tobacco: None  . Alcohol Use: No    Allergies: No Known Allergies  Prescriptions prior to admission  Medication Sig Dispense Refill Last Dose  . Iron-Vitamins (VITAFOL PO) Take by mouth.   Taking  . levothyroxine (SYNTHROID, LEVOTHROID) 150 MCG tablet Take 125 mcg by mouth daily.  5   . omeprazole (PRILOSEC) 20 MG capsule   5     Review of Systems  Constitutional: Negative.   Gastrointestinal: Positive for abdominal pain. Negative for nausea, vomiting, diarrhea and constipation.  Genitourinary: Negative.    Physical Exam   Blood pressure 106/61, pulse 102, temperature 97.9 F (36.6 C), temperature source Oral, resp. rate 18, height 5\' 7"  (1.702 m), last menstrual period  08/14/2015.  Physical Exam  Nursing note and vitals reviewed. Constitutional: She is oriented to person, place, and time. She appears well-developed and well-nourished. No distress.  HENT:  Head: Normocephalic and atraumatic.  Eyes: Conjunctivae are normal. Right eye exhibits no discharge. Left eye exhibits no discharge. No scleral icterus.  Neck: Normal range of motion.  Cardiovascular: Normal rate, regular rhythm and normal heart sounds.   No murmur heard. Respiratory: Effort normal and breath sounds normal. No respiratory distress. She has no wheezes.  GI: Soft. Bowel sounds are normal. There is no tenderness.  Neurological: She is alert and oriented to person, place, and time.  Skin: Skin is warm and dry. She is not diaphoretic.  Psychiatric: She has a normal mood and affect. Her behavior is normal. Judgment and thought content normal.   Fetal Tracing:  Baseline: 150 Variability: moderate Accelerations: 10x10 Decelerations: none  Toco: none  MAU Course  Procedures Results for orders placed or performed during the hospital encounter of 02/27/16 (from the past 24 hour(s))  Urinalysis, Routine w reflex microscopic (not at [facility])     Status: Abnormal   Collection Time: 02/27/16 12:30 PM  Result Value Ref Range   Color, Urine YELLOW YELLOW   APPearance CLEAR CLEAR   Specific Gravity, Urine 1.020 1.005 - 1.030   pH 6.0 5.0 - 8.0   Glucose, UA  NEGATIVE NEGATIVE mg/dL   Hgb urine dipstick NEGATIVE NEGATIVE   Bilirubin Urine NEGATIVE NEGATIVE   Ketones, ur 40 (A) NEGATIVE mg/dL   Protein, ur NEGATIVE NEGATIVE mg/dL   Nitrite NEGATIVE NEGATIVE   Leukocytes, UA NEGATIVE NEGATIVE    MDM Category 1 tracing, no contractions.  Pt states she's "impatiently waiting" for the IV fluids & doesn't understand what she's waiting for or why she needs fetal monitoring again. Patient taken off monitor. Declines cervical exam b/c she states she "doesn't need it".  S/w Dr. Clearance Coots regarding  patient. Ok for discharge after bag of fluids.   Assessment and Plan  A: 1. Dehydration   2. Abdominal cramping affecting pregnancy     P: Discharge home Increase PO fluid intake Discussed reasons to return Keep f/u with Dr. Inge Rise 02/27/2016, 12:35 PM

## 2016-02-27 NOTE — MAU Note (Signed)
Patient sent from office at [redacted] weeks gestation with c/o abdominal cramping. Fetus active. Denies bleeding or discharge.

## 2016-02-27 NOTE — Progress Notes (Signed)
Patient is having "cramping" that is irregular- down low. Feels painful.

## 2016-03-01 ENCOUNTER — Encounter: Payer: Self-pay | Admitting: Obstetrics

## 2016-03-01 NOTE — Progress Notes (Signed)
Subjective:    Lori Hanson is a 40 y.o. female being seen today for her obstetrical visit. She is at 266w4d gestation. Patient reports no complaints. Fetal movement: normal.  Problem List Items Addressed This Visit    None    Visit Diagnoses    Prenatal care, second trimester    -  Primary    Relevant Orders    POCT urinalysis dipstick (Completed)      There are no active problems to display for this patient.  Objective:    BP 117/79 mmHg  Pulse 101  Temp(Src) 98 F (36.7 C)  Wt 217 lb 9.6 oz (98.703 kg)  LMP 08/14/2015 FHT:  150 BPM  Uterine Size: size equals dates  Presentation: unsure     Assessment:    Pregnancy @ 2666w4d weeks   Plan:     labs reviewed, problem list updated Consent signed. GBS sent TDAP offered  Rhogam given for RH negative Pediatrician: discussed. Infant feeding: plans to breastfeed. Maternity leave: discussed. Cigarette smoking: FORMER SMOKER. Orders Placed This Encounter  Procedures  . POCT urinalysis dipstick   Meds ordered this encounter  Medications  . levothyroxine (SYNTHROID, LEVOTHROID) 150 MCG tablet    Sig: Take 125 mcg by mouth daily.    Refill:  5  . Iron-Vitamins (VITAFOL PO)    Sig: Take by mouth.   Follow up in 2 Weeks.

## 2016-03-12 ENCOUNTER — Ambulatory Visit (INDEPENDENT_AMBULATORY_CARE_PROVIDER_SITE_OTHER): Payer: Medicaid Other | Admitting: Obstetrics

## 2016-03-12 ENCOUNTER — Encounter: Payer: Self-pay | Admitting: Obstetrics

## 2016-03-12 VITALS — BP 117/79 | HR 106 | Temp 98.9°F | Wt 220.8 lb

## 2016-03-12 DIAGNOSIS — O3663X1 Maternal care for excessive fetal growth, third trimester, fetus 1: Secondary | ICD-10-CM

## 2016-03-12 DIAGNOSIS — Z3493 Encounter for supervision of normal pregnancy, unspecified, third trimester: Secondary | ICD-10-CM

## 2016-03-12 LAB — POCT URINALYSIS DIPSTICK
Bilirubin, UA: NEGATIVE
Blood, UA: NEGATIVE
GLUCOSE UA: NEGATIVE
Ketones, UA: NEGATIVE
Leukocytes, UA: NEGATIVE
NITRITE UA: NEGATIVE
Spec Grav, UA: <=1.005
Urobilinogen, UA: NEGATIVE
pH, UA: 6

## 2016-03-12 NOTE — Progress Notes (Signed)
Patient is doing better- she is noticing some swelling in her feet.

## 2016-03-12 NOTE — Progress Notes (Signed)
Subjective:    Lori Hanson is a 40 y.o. female being seen today for her obstetrical visit. She is at 3594w1d gestation. Patient reports no complaints. Fetal movement: normal.  Problem List Items Addressed This Visit    None    Visit Diagnoses    Prenatal care, third trimester    -  Primary    Relevant Orders    POCT urinalysis dipstick (Completed)    LGA (large for gestational age) fetus affecting management of mother, third trimester, fetus 1        Relevant Orders    US MFM OB COMP + 14 WK      There are no active problems to display for this patient.  Objective:    BP 117/79 mmHg  Pulse 106  Temp(Src) 98.9 F (37.2 C)  Wt 220 lb 12.8 oz (100.154 kg)  LMP 08/14/2015 FHT:  150 BPM  Uterine Size: size greater than dates  Presentation: unsure     Assessment:    Pregnancy @ 1294w1d weeks    Suspect LGA fetus  Plan:   Ultrasound ordered for growth   labs reviewed, problem list updated Consent signed. GBS sent TDAP offered  Rhogam given for RH negative Pediatrician: discussed. Infant feeding: plans to breastfeed. Maternity leave: discussed. Cigarette smoking: former smoker.  Orders Placed This Encounter  Procedures  . US MFM OB COMP + 14 WK    Standing Status: Future     Number of Occurrences:      Standing Expiration Date: 05/12/2017    Order Specific Question:  Reason for Exam (SYMPTOM  OR DIAGNOSIS REQUIRED)    Answer:  LGA    Order Specific Question:  Preferred imaging location?    Answer:  [facility]  . POCT urinalysis dipstick   No orders of the defined types were placed in this encounter.   Follow up in 2 Weeks.

## 2016-03-16 ENCOUNTER — Encounter (HOSPITAL_COMMUNITY): Payer: Self-pay

## 2016-03-16 ENCOUNTER — Other Ambulatory Visit: Payer: Self-pay | Admitting: Obstetrics

## 2016-03-16 ENCOUNTER — Ambulatory Visit (HOSPITAL_COMMUNITY)
Admission: RE | Admit: 2016-03-16 | Discharge: 2016-03-16 | Disposition: A | Discharge status: Home / Self Care | Payer: Medicaid Other | Source: Ambulatory Visit | Attending: Obstetrics | Admitting: Obstetrics

## 2016-03-16 DIAGNOSIS — O3663X1 Maternal care for excessive fetal growth, third trimester, fetus 1: Secondary | ICD-10-CM

## 2016-03-16 DIAGNOSIS — Z3A30 30 weeks gestation of pregnancy: Secondary | ICD-10-CM

## 2016-03-16 DIAGNOSIS — O99213 Obesity complicating pregnancy, third trimester: Secondary | ICD-10-CM | POA: Diagnosis not present

## 2016-03-16 DIAGNOSIS — E0590 Thyrotoxicosis, unspecified without thyrotoxic crisis or storm: Secondary | ICD-10-CM | POA: Insufficient documentation

## 2016-03-16 DIAGNOSIS — O26843 Uterine size-date discrepancy, third trimester: Secondary | ICD-10-CM | POA: Insufficient documentation

## 2016-03-16 DIAGNOSIS — O99283 Endocrine, nutritional and metabolic diseases complicating pregnancy, third trimester: Secondary | ICD-10-CM | POA: Diagnosis not present

## 2016-03-16 DIAGNOSIS — O09523 Supervision of elderly multigravida, third trimester: Secondary | ICD-10-CM | POA: Insufficient documentation

## 2016-03-16 DIAGNOSIS — E079 Disorder of thyroid, unspecified: Secondary | ICD-10-CM

## 2016-03-26 ENCOUNTER — Ambulatory Visit (INDEPENDENT_AMBULATORY_CARE_PROVIDER_SITE_OTHER): Payer: Medicaid Other | Admitting: Obstetrics

## 2016-03-26 ENCOUNTER — Encounter: Payer: Self-pay | Admitting: Obstetrics

## 2016-03-26 VITALS — BP 126/70 | HR 111 | Wt 230.0 lb

## 2016-03-26 DIAGNOSIS — Z3493 Encounter for supervision of normal pregnancy, unspecified, third trimester: Secondary | ICD-10-CM

## 2016-03-26 LAB — POCT URINALYSIS DIPSTICK
Bilirubin, UA: NEGATIVE
Blood, UA: NEGATIVE
Glucose, UA: NEGATIVE
Leukocytes, UA: NEGATIVE
Nitrite, UA: NEGATIVE
PH UA: 7
SPEC GRAV UA: 1.01
UROBILINOGEN UA: NEGATIVE

## 2016-03-26 NOTE — Progress Notes (Signed)
Patient has no concerns or questions

## 2016-03-26 NOTE — Progress Notes (Signed)
Subjective:    Lear NgMichele S Hassebrock is a 40 y.o. female being seen today for her obstetrical visit. She is at 3266w1d gestation. Patient reports no complaints. Fetal movement: normal.  Problem List Items Addressed This Visit    None    Visit Diagnoses    Prenatal care, third trimester    -  Primary    Relevant Orders    POCT urinalysis dipstick (Completed)      There are no active problems to display for this patient.  Objective:    BP 126/70 mmHg  Pulse 111  Wt 230 lb (104.327 kg)  LMP 08/14/2015 FHT:  150 BPM  Uterine Size: size greater than dates  Presentation: unsure     Assessment:    Pregnancy @ 7066w1d weeks   Plan:     labs reviewed, problem list updated Consent signed. GBS sent TDAP offered  Rhogam given for RH negative Pediatrician: discussed. Infant feeding: plans to breastfeed. Maternity leave: discussed. Cigarette smoking: former smoker. Orders Placed This Encounter  Procedures  . POCT urinalysis dipstick   No orders of the defined types were placed in this encounter.   Follow up in 2 Weeks.

## 2016-04-09 ENCOUNTER — Encounter: Payer: Medicaid Other | Admitting: Obstetrics

## 2016-04-18 ENCOUNTER — Ambulatory Visit (INDEPENDENT_AMBULATORY_CARE_PROVIDER_SITE_OTHER): Payer: Medicaid Other | Admitting: Obstetrics

## 2016-04-18 VITALS — BP 120/78 | HR 109 | Wt 224.0 lb

## 2016-04-18 DIAGNOSIS — Z3483 Encounter for supervision of other normal pregnancy, third trimester: Secondary | ICD-10-CM

## 2016-04-18 LAB — POCT URINALYSIS DIPSTICK
Bilirubin, UA: NEGATIVE
Glucose, UA: NEGATIVE
Ketones, UA: NEGATIVE
Leukocytes, UA: NEGATIVE
NITRITE UA: NEGATIVE
PH UA: 6
Protein, UA: NEGATIVE
RBC UA: NEGATIVE
SPEC GRAV UA: 1.01
Urobilinogen, UA: NEGATIVE

## 2016-04-18 LAB — OB RESULTS CONSOLE GBS: GBS: POSITIVE

## 2016-04-19 ENCOUNTER — Encounter: Payer: Self-pay | Admitting: Obstetrics

## 2016-04-19 NOTE — Progress Notes (Signed)
Subjective:    Lori Hanson is a 40 y.o. female being seen today for her obstetrical visit. She is at 3463w4d gestation. Patient reports: no complaints . Fetal movement: normal.  Problem List Items Addressed This Visit    None    Visit Diagnoses    Encounter for supervision of other normal pregnancy in third trimester    -  Primary    Relevant Orders    Strep Gp B NAA    POCT urinalysis dipstick (Completed)    NuSwab VG+, Candida 6sp      There are no active problems to display for this patient.  Objective:    BP 120/78 mmHg  Pulse 109  Wt 224 lb (101.606 kg)  LMP 08/14/2015 FHT: 150 BPM  Uterine Size: Size > Dates     Assessment:    Pregnancy @ 663w4d    Plan:    Signs and symptoms of preterm labor: discussed.  Labs, problem list reviewed and updated  Follow up in 1 weeks.

## 2016-04-20 LAB — STREP GP B NAA: Strep Gp B NAA: POSITIVE — AB

## 2016-04-21 ENCOUNTER — Other Ambulatory Visit: Payer: Self-pay | Admitting: Obstetrics

## 2016-04-21 DIAGNOSIS — B3731 Acute candidiasis of vulva and vagina: Secondary | ICD-10-CM

## 2016-04-21 DIAGNOSIS — B9689 Other specified bacterial agents as the cause of diseases classified elsewhere: Secondary | ICD-10-CM

## 2016-04-21 DIAGNOSIS — N760 Acute vaginitis: Principal | ICD-10-CM

## 2016-04-21 DIAGNOSIS — B373 Candidiasis of vulva and vagina: Secondary | ICD-10-CM

## 2016-04-21 LAB — NUSWAB VG+, CANDIDA 6SP
Atopobium vaginae: HIGH Score — AB
BVAB 2: HIGH {score} — AB
CANDIDA GLABRATA, NAA: NEGATIVE
CANDIDA TROPICALIS, NAA: NEGATIVE
Candida albicans, NAA: NEGATIVE
Candida krusei, NAA: NEGATIVE
Candida lusitaniae, NAA: NEGATIVE
Candida parapsilosis, NAA: NEGATIVE
Chlamydia trachomatis, NAA: NEGATIVE
MEGASPHAERA 1: HIGH {score} — AB
NEISSERIA GONORRHOEAE, NAA: NEGATIVE
TRICH VAG BY NAA: NEGATIVE

## 2016-04-21 MED ORDER — METRONIDAZOLE 500 MG PO TABS: 500.0000 mg | ORAL_TABLET | Freq: Two times a day (BID) | ORAL | Status: DC

## 2016-04-21 MED ORDER — FLUCONAZOLE 150 MG PO TABS: 150.0000 mg | ORAL_TABLET | Freq: Once | ORAL | Status: DC

## 2016-04-24 ENCOUNTER — Encounter: Payer: Self-pay | Admitting: *Deleted

## 2016-04-26 ENCOUNTER — Ambulatory Visit (INDEPENDENT_AMBULATORY_CARE_PROVIDER_SITE_OTHER): Payer: Medicaid Other | Admitting: Obstetrics

## 2016-04-26 VITALS — BP 129/80 | HR 102 | Wt 228.0 lb

## 2016-04-26 DIAGNOSIS — M549 Dorsalgia, unspecified: Secondary | ICD-10-CM

## 2016-04-26 DIAGNOSIS — Z3493 Encounter for supervision of normal pregnancy, unspecified, third trimester: Secondary | ICD-10-CM

## 2016-04-26 LAB — POCT URINALYSIS DIPSTICK
Bilirubin, UA: NEGATIVE
Blood, UA: NEGATIVE
Glucose, UA: NEGATIVE
Ketones, UA: NEGATIVE
LEUKOCYTES UA: NEGATIVE
Nitrite, UA: NEGATIVE
PROTEIN UA: NEGATIVE
Spec Grav, UA: 1.01
UROBILINOGEN UA: NEGATIVE
pH, UA: 6

## 2016-04-26 MED ORDER — OXYCODONE HCL 10 MG PO TABS: 10.0000 mg | ORAL_TABLET | Freq: Four times a day (QID) | ORAL | Status: DC | PRN

## 2016-04-30 ENCOUNTER — Encounter: Payer: Self-pay | Admitting: Obstetrics

## 2016-04-30 NOTE — Progress Notes (Signed)
Subjective:    Lori Hanson is a 40 y.o. female being seen today for her obstetrical visit. She is at 4023w1d gestation. Patient reports no complaints. Fetal movement: normal.  Problem List Items Addressed This Visit    None    Visit Diagnoses    Prenatal care in third trimester    -  Primary    Relevant Orders    POCT Urinalysis Dipstick (Completed)    Backache with radiation        Relevant Medications    Oxycodone HCl 10 MG TABS      There are no active problems to display for this patient.   Objective:    BP 129/80 mmHg  Pulse 102  Wt 228 lb (103.42 kg)  LMP 08/14/2015 FHT: 150 BPM  Uterine Size: size equals dates  Presentations: unsure    Assessment:    Pregnancy @ 7823w1d weeks   Plan:   Plans for delivery: Vaginal anticipated; labs reviewed; problem list updated Counseling: Consent signed. Infant feeding: plans to breastfeed. Cigarette smoking: former smoker. L&D discussion: symptoms of labor, discussed when to call, discussed what number to call, anesthetic/analgesic options reviewed and delivering clinician:  plans no preference. Postpartum supports and preparation: circumcision discussed and contraception plans discussed.  Follow up in 1 Week.

## 2016-05-04 ENCOUNTER — Encounter: Payer: Self-pay | Admitting: Obstetrics

## 2016-05-04 ENCOUNTER — Ambulatory Visit (INDEPENDENT_AMBULATORY_CARE_PROVIDER_SITE_OTHER): Payer: Medicaid Other | Admitting: Obstetrics

## 2016-05-04 ENCOUNTER — Telehealth (HOSPITAL_COMMUNITY): Payer: Self-pay | Admitting: *Deleted

## 2016-05-04 VITALS — BP 123/84 | HR 108 | Wt 227.0 lb

## 2016-05-04 DIAGNOSIS — Z3493 Encounter for supervision of normal pregnancy, unspecified, third trimester: Secondary | ICD-10-CM

## 2016-05-04 DIAGNOSIS — M549 Dorsalgia, unspecified: Secondary | ICD-10-CM

## 2016-05-04 LAB — POCT URINALYSIS DIPSTICK
BILIRUBIN UA: NEGATIVE
Blood, UA: NEGATIVE
GLUCOSE UA: NEGATIVE
KETONES UA: NEGATIVE
LEUKOCYTES UA: NEGATIVE
NITRITE UA: NEGATIVE
Spec Grav, UA: <=1.005
Urobilinogen, UA: NEGATIVE
pH, UA: 7

## 2016-05-04 NOTE — Telephone Encounter (Signed)
Preadmission screen  

## 2016-05-04 NOTE — Progress Notes (Signed)
Subjective:    Lori NgMichele S Hanson is a 40 y.o. female being seen today for her obstetrical visit. She is at 3758w5d gestation. Patient reports backache and occasional contractions. Fetal movement: normal.  Problem List Items Addressed This Visit    None    Visit Diagnoses    Backache with radiation    -  Primary    Prenatal care, third trimester        Relevant Orders    POCT urinalysis dipstick (Completed)      There are no active problems to display for this patient.   Objective:    BP 123/84 mmHg  Pulse 108  Wt 227 lb (102.967 kg)  LMP 08/14/2015 FHT: 150 BPM  Uterine Size: size equals dates  Presentations: unsure    Assessment:    Pregnancy @ 6858w5d weeks   Plan:   Plans for delivery: Vaginal anticipated; labs reviewed; problem list updated Counseling: Consent signed. Infant feeding: plans to breastfeed. Cigarette smoking: former smoker. L&D discussion: symptoms of labor, discussed when to call, discussed what number to call, anesthetic/analgesic options reviewed and delivering clinician:  plans no preference. Postpartum supports and preparation: circumcision discussed and contraception plans discussed.  Follow up in 1 Week.

## 2016-05-11 ENCOUNTER — Ambulatory Visit (INDEPENDENT_AMBULATORY_CARE_PROVIDER_SITE_OTHER): Payer: Medicaid Other | Admitting: Obstetrics

## 2016-05-11 VITALS — BP 134/83 | HR 96 | Wt 230.0 lb

## 2016-05-11 DIAGNOSIS — Z3493 Encounter for supervision of normal pregnancy, unspecified, third trimester: Secondary | ICD-10-CM

## 2016-05-11 LAB — POCT URINALYSIS DIPSTICK
Bilirubin, UA: NEGATIVE
Glucose, UA: NEGATIVE
KETONES UA: NEGATIVE
LEUKOCYTES UA: NEGATIVE
Nitrite, UA: NEGATIVE
PH UA: 6.5
PROTEIN UA: NEGATIVE
Spec Grav, UA: 1.015
Urobilinogen, UA: NEGATIVE

## 2016-05-13 LAB — CULTURE, OB URINE

## 2016-05-13 LAB — URINE CULTURE, OB REFLEX

## 2016-05-15 ENCOUNTER — Encounter: Payer: Self-pay | Admitting: Obstetrics

## 2016-05-15 ENCOUNTER — Inpatient Hospital Stay (HOSPITAL_COMMUNITY): Payer: Medicaid Other | Admitting: Anesthesiology

## 2016-05-15 ENCOUNTER — Inpatient Hospital Stay (HOSPITAL_COMMUNITY)
Admission: RE | Admit: 2016-05-15 | Discharge: 2016-05-17 | DRG: 775 | Disposition: A | Discharge status: Home / Self Care | Payer: Medicaid Other | Source: Ambulatory Visit | Attending: Obstetrics | Admitting: Obstetrics

## 2016-05-15 ENCOUNTER — Encounter (HOSPITAL_COMMUNITY): Payer: Self-pay

## 2016-05-15 DIAGNOSIS — O99284 Endocrine, nutritional and metabolic diseases complicating childbirth: Secondary | ICD-10-CM | POA: Diagnosis present

## 2016-05-15 DIAGNOSIS — O9902 Anemia complicating childbirth: Secondary | ICD-10-CM | POA: Diagnosis present

## 2016-05-15 DIAGNOSIS — D649 Anemia, unspecified: Secondary | ICD-10-CM | POA: Diagnosis present

## 2016-05-15 DIAGNOSIS — Z6836 Body mass index (BMI) 36.0-36.9, adult: Secondary | ICD-10-CM | POA: Diagnosis not present

## 2016-05-15 DIAGNOSIS — Z3A39 39 weeks gestation of pregnancy: Secondary | ICD-10-CM

## 2016-05-15 DIAGNOSIS — E669 Obesity, unspecified: Secondary | ICD-10-CM | POA: Diagnosis present

## 2016-05-15 DIAGNOSIS — E039 Hypothyroidism, unspecified: Secondary | ICD-10-CM | POA: Diagnosis present

## 2016-05-15 DIAGNOSIS — Z87891 Personal history of nicotine dependence: Secondary | ICD-10-CM | POA: Diagnosis not present

## 2016-05-15 DIAGNOSIS — O99214 Obesity complicating childbirth: Secondary | ICD-10-CM | POA: Diagnosis present

## 2016-05-15 DIAGNOSIS — O99824 Streptococcus B carrier state complicating childbirth: Secondary | ICD-10-CM | POA: Diagnosis present

## 2016-05-15 LAB — CBC
HEMATOCRIT: 37.4 % (ref 36.0–46.0)
HEMOGLOBIN: 12.2 g/dL (ref 12.0–15.0)
MCH: 29.8 pg (ref 26.0–34.0)
MCHC: 32.6 g/dL (ref 30.0–36.0)
MCV: 91.2 fL (ref 78.0–100.0)
Platelets: 377 10*3/uL (ref 150–400)
RBC: 4.1 MIL/uL (ref 3.87–5.11)
RDW: 15.3 % (ref 11.5–15.5)
WBC: 16.2 10*3/uL — AB (ref 4.0–10.5)

## 2016-05-15 LAB — RPR: RPR Ser Ql: NONREACTIVE

## 2016-05-15 LAB — ABO/RH: ABO/RH(D): O POS

## 2016-05-15 LAB — TYPE AND SCREEN
ABO/RH(D): O POS
ANTIBODY SCREEN: NEGATIVE

## 2016-05-15 MED ORDER — ONDANSETRON HCL 4 MG PO TABS
4.0000 mg | ORAL_TABLET | ORAL | Status: DC | PRN
  Filled 2016-05-15: qty 1

## 2016-05-15 MED ORDER — DIPHENHYDRAMINE HCL 50 MG/ML IJ SOLN: 12.5000 mg | INTRAMUSCULAR | Status: DC | PRN

## 2016-05-15 MED ORDER — IBUPROFEN 600 MG PO TABS
600.0000 mg | ORAL_TABLET | Freq: Four times a day (QID) | ORAL | Status: DC
  Administered 2016-05-16 – 2016-05-17 (×5): 600 mg via ORAL
  Filled 2016-05-15 (×6): qty 1

## 2016-05-15 MED ORDER — OXYCODONE-ACETAMINOPHEN 5-325 MG PO TABS
2.0000 | ORAL_TABLET | ORAL | Status: DC | PRN
  Administered 2016-05-16 – 2016-05-17 (×4): 2 via ORAL
  Filled 2016-05-15 (×5): qty 2

## 2016-05-15 MED ORDER — WITCH HAZEL-GLYCERIN EX PADS: 1.0000 "application " | MEDICATED_PAD | CUTANEOUS | Status: DC | PRN

## 2016-05-15 MED ORDER — SOD CITRATE-CITRIC ACID 500-334 MG/5ML PO SOLN
30.0000 mL | ORAL | Status: DC | PRN
  Filled 2016-05-15: qty 15

## 2016-05-15 MED ORDER — OXYTOCIN BOLUS FROM INFUSION
500.0000 mL | INTRAVENOUS | Status: DC
  Administered 2016-05-15: 500 mL via INTRAVENOUS

## 2016-05-15 MED ORDER — NALBUPHINE HCL 10 MG/ML IJ SOLN
10.0000 mg | INTRAMUSCULAR | Status: DC | PRN
  Administered 2016-05-15: 10 mg via INTRAVENOUS
  Filled 2016-05-15: qty 1

## 2016-05-15 MED ORDER — PRENATAL MULTIVITAMIN CH
1.0000 | ORAL_TABLET | Freq: Every day | ORAL | Status: DC
  Administered 2016-05-16: 1 via ORAL
  Filled 2016-05-15: qty 1

## 2016-05-15 MED ORDER — ACETAMINOPHEN 325 MG PO TABS: 650.0000 mg | ORAL_TABLET | ORAL | Status: DC | PRN

## 2016-05-15 MED ORDER — OXYTOCIN 40 UNITS IN LACTATED RINGERS INFUSION - SIMPLE MED
1.0000 m[IU]/min | INTRAVENOUS | Status: DC
  Administered 2016-05-15: 2 m[IU]/min via INTRAVENOUS
  Filled 2016-05-15: qty 1000

## 2016-05-15 MED ORDER — TERBUTALINE SULFATE 1 MG/ML IJ SOLN
0.2500 mg | Freq: Once | INTRAMUSCULAR | Status: DC | PRN
  Filled 2016-05-15: qty 1

## 2016-05-15 MED ORDER — PENICILLIN G POTASSIUM 5000000 UNITS IJ SOLR
5.0000 10*6.[IU] | Freq: Once | INTRAMUSCULAR | Status: AC
  Administered 2016-05-15: 5 10*6.[IU] via INTRAVENOUS
  Filled 2016-05-15: qty 5

## 2016-05-15 MED ORDER — COCONUT OIL OIL: 1.0000 "application " | TOPICAL_OIL | Status: DC | PRN

## 2016-05-15 MED ORDER — BENZOCAINE-MENTHOL 20-0.5 % EX AERO: 1.0000 "application " | INHALATION_SPRAY | CUTANEOUS | Status: DC | PRN

## 2016-05-15 MED ORDER — EPHEDRINE 5 MG/ML INJ
10.0000 mg | INTRAVENOUS | Status: DC | PRN
  Filled 2016-05-15: qty 2

## 2016-05-15 MED ORDER — SIMETHICONE 80 MG PO CHEW: 80.0000 mg | CHEWABLE_TABLET | ORAL | Status: DC | PRN

## 2016-05-15 MED ORDER — PHENYLEPHRINE 40 MCG/ML (10ML) SYRINGE FOR IV PUSH (FOR BLOOD PRESSURE SUPPORT)
80.0000 ug | PREFILLED_SYRINGE | INTRAVENOUS | Status: DC | PRN
  Filled 2016-05-15: qty 5

## 2016-05-15 MED ORDER — MISOPROSTOL 200 MCG PO TABS
50.0000 ug | ORAL_TABLET | ORAL | Status: DC
  Administered 2016-05-15: 50 ug via ORAL
  Filled 2016-05-15: qty 0.5

## 2016-05-15 MED ORDER — ZOLPIDEM TARTRATE 5 MG PO TABS: 5.0000 mg | ORAL_TABLET | Freq: Every evening | ORAL | Status: DC | PRN

## 2016-05-15 MED ORDER — OXYCODONE-ACETAMINOPHEN 5-325 MG PO TABS
1.0000 | ORAL_TABLET | ORAL | Status: DC | PRN
Start: 2016-05-15 — End: 2016-05-15

## 2016-05-15 MED ORDER — OXYTOCIN 40 UNITS IN LACTATED RINGERS INFUSION - SIMPLE MED: 2.5000 [IU]/h | INTRAVENOUS | Status: DC | PRN

## 2016-05-15 MED ORDER — ACETAMINOPHEN 325 MG PO TABS
650.0000 mg | ORAL_TABLET | ORAL | Status: DC | PRN
  Administered 2016-05-16: 650 mg via ORAL
  Filled 2016-05-15: qty 2

## 2016-05-15 MED ORDER — ONDANSETRON HCL 4 MG/2ML IJ SOLN: 4.0000 mg | INTRAMUSCULAR | Status: DC | PRN

## 2016-05-15 MED ORDER — OXYCODONE-ACETAMINOPHEN 5-325 MG PO TABS
1.0000 | ORAL_TABLET | ORAL | Status: DC | PRN
  Administered 2016-05-16 (×2): 1 via ORAL
  Filled 2016-05-15 (×2): qty 1

## 2016-05-15 MED ORDER — LACTATED RINGERS IV SOLN: 500.0000 mL | INTRAVENOUS | Status: DC | PRN

## 2016-05-15 MED ORDER — FENTANYL 2.5 MCG/ML BUPIVACAINE 1/10 % EPIDURAL INFUSION (WH - ANES)
14.0000 mL/h | INTRAMUSCULAR | Status: DC | PRN
  Administered 2016-05-15 (×2): 14 mL/h via EPIDURAL
  Filled 2016-05-15 (×2): qty 125

## 2016-05-15 MED ORDER — LIDOCAINE HCL (PF) 1 % IJ SOLN
30.0000 mL | INTRAMUSCULAR | Status: DC | PRN
  Filled 2016-05-15: qty 30

## 2016-05-15 MED ORDER — PHENYLEPHRINE 40 MCG/ML (10ML) SYRINGE FOR IV PUSH (FOR BLOOD PRESSURE SUPPORT)
80.0000 ug | PREFILLED_SYRINGE | INTRAVENOUS | Status: DC | PRN
  Filled 2016-05-15: qty 10
  Filled 2016-05-15: qty 5

## 2016-05-15 MED ORDER — PENICILLIN G POTASSIUM 5000000 UNITS IJ SOLR
2.5000 10*6.[IU] | INTRAVENOUS | Status: DC
  Administered 2016-05-15 (×2): 2.5 10*6.[IU] via INTRAVENOUS
  Filled 2016-05-15 (×4): qty 2.5

## 2016-05-15 MED ORDER — SENNOSIDES-DOCUSATE SODIUM 8.6-50 MG PO TABS
2.0000 | ORAL_TABLET | ORAL | Status: DC
  Administered 2016-05-16: 2 via ORAL
  Filled 2016-05-15 (×2): qty 2

## 2016-05-15 MED ORDER — LACTATED RINGERS IV SOLN: 500.0000 mL | Freq: Once | INTRAVENOUS | Status: DC

## 2016-05-15 MED ORDER — OXYTOCIN 40 UNITS IN LACTATED RINGERS INFUSION - SIMPLE MED: 2.5000 [IU]/h | INTRAVENOUS | Status: DC

## 2016-05-15 MED ORDER — TETANUS-DIPHTH-ACELL PERTUSSIS 5-2.5-18.5 LF-MCG/0.5 IM SUSP: 0.5000 mL | Freq: Once | INTRAMUSCULAR | Status: DC

## 2016-05-15 MED ORDER — DIBUCAINE 1 % RE OINT: 1.0000 "application " | TOPICAL_OINTMENT | RECTAL | Status: DC | PRN

## 2016-05-15 MED ORDER — ONDANSETRON HCL 4 MG/2ML IJ SOLN: 4.0000 mg | Freq: Four times a day (QID) | INTRAMUSCULAR | Status: DC | PRN

## 2016-05-15 MED ORDER — DIPHENHYDRAMINE HCL 25 MG PO CAPS: 25.0000 mg | ORAL_CAPSULE | Freq: Four times a day (QID) | ORAL | Status: DC | PRN

## 2016-05-15 MED ORDER — OXYCODONE-ACETAMINOPHEN 5-325 MG PO TABS: 2.0000 | ORAL_TABLET | ORAL | Status: DC | PRN

## 2016-05-15 MED ORDER — LIDOCAINE HCL (PF) 1 % IJ SOLN
INTRAMUSCULAR | Status: DC | PRN
  Administered 2016-05-15 (×2): 4 mL via EPIDURAL

## 2016-05-15 MED ORDER — LACTATED RINGERS IV SOLN
INTRAVENOUS | Status: DC
  Administered 2016-05-15 (×3): via INTRAVENOUS

## 2016-05-15 MED ORDER — PROMETHAZINE HCL 25 MG/ML IJ SOLN
25.0000 mg | Freq: Four times a day (QID) | INTRAMUSCULAR | Status: DC | PRN
  Administered 2016-05-15: 25 mg via INTRAMUSCULAR
  Filled 2016-05-15: qty 1

## 2016-05-15 MED ORDER — NALBUPHINE HCL 10 MG/ML IJ SOLN
10.0000 mg | INTRAMUSCULAR | Status: DC | PRN
  Administered 2016-05-15: 10 mg via INTRAMUSCULAR
  Filled 2016-05-15: qty 1

## 2016-05-15 NOTE — Progress Notes (Signed)
Lori Hanson is a 7Lear [id number] y.o. [id number] at 4635w2d by LMP admitted for induction of labor due to Elective at term.  Subjective:   Objective: BP 125/74 mmHg  Pulse 102  Temp(Src) 98.5 F (36.9 C) (Oral)  Resp 18  Ht 5\' 7"  (1.702 m)  Wt 230 lb (104.327 kg)  BMI 36.01 kg/m2  SpO2 96%  LMP 08/14/2015      FHT:  FHR: 120 bpm, variability: moderate,  accelerations:  Present,  decelerations:  Present early variable UC:   regular, every 2-5 minutes SVE:   Dilation: 1.5 Effacement (%): 70 Station: -2 Exam by:: J.Cox, RN  Labs: Lab Results  Component Value Date   WBC 16.2* 05/15/2016   HGB 12.2 05/15/2016   HCT 37.4 05/15/2016   MCV 91.2 05/15/2016   PLT 377 05/15/2016    Assessment / Plan: 39 weeks.  Multiparity.  Elective IOL.  Increase pitocin prn.  Labor: Early Preeclampsia:  n/a Fetal Wellbeing:  Category I Pain Control:  Epidural and IV pain meds I/D:  n/a Anticipated MOD:  NSVD  Lori Hanson A 05/15/2016, 1:28 PM

## 2016-05-15 NOTE — Progress Notes (Signed)
Lori Hanson is a 40 y.o. [id number] at 3554w2d by LMP admitted for induction of labor due to Elective at term.  Subjective:   Objective: BP 129/78 mmHg  Pulse 104  Temp(Src) 98.2 F (36.8 C) (Oral)  Resp 18  Ht 5\' 7"  (1.702 m)  Wt 230 lb (104.327 kg)  BMI 36.01 kg/m2  SpO2 97%  LMP 08/14/2015   Total I/O In: -  Out: 350 [Blood:350]  FHT:  FHR: 125 bpm, variability: moderate,  accelerations:  Present,  decelerations:  Present variable UC:   regular, every 2-3 minutes SVE:   Dilation: 10 Effacement (%): 100 Station: +2 Exam by:: J.Cox, RN  Labs: Lab Results  Component Value Date   WBC 16.2* 05/15/2016   HGB 12.2 05/15/2016   HCT 37.4 05/15/2016   MCV 91.2 05/15/2016   PLT 377 05/15/2016    Assessment / Plan: Augmentation of labor, progressing well  Labor: Progressing normally Preeclampsia:  n/a Fetal Wellbeing:  Category I Pain Control:  Epidural I/D:  n/a Anticipated MOD:  NSVD  HARPER,CHARLES A 05/15/2016, 8:05 PM

## 2016-05-15 NOTE — Anesthesia Pain Management Evaluation Note (Signed)
  CRNA Pain Management Visit Note  Patient: Lori Hanson, 40 y.o., female  "Hello I am a member of the anesthesia team at [facility]. We have an anesthesia team available at all times to provide care throughout the hospital, including epidural management and anesthesia for C-section. I don't know your plan for the delivery whether it a natural birth, water birth, IV sedation, nitrous supplementation, doula or epidural, but we want to meet your pain goals."   1.Was your pain managed to your expectations on prior hospitalizations? Unable to ask since patient hurting so bad now.    2.What is your expectation for pain management during this hospitalization?     Epidural  3.How can we help you reach that goal? Epidural. Spoke to RN who said Dr. Clearance CootsHarper does not want her to get epidural until she is about 4cm.  Told nurse that patient is saying her pain level is now 9 with contractions. Nurse said she would talk to patient.   Record the patient's initial score and the patient's pain goal.   Pain: 9  Pain Goal: 9 [facility] wants you to be able to say your pain was always managed very well.  Lori Hanson 05/15/2016

## 2016-05-15 NOTE — Plan of Care (Signed)
Spoke with Dr. Clearance CootsHarper about restarting pitocin will hold pitocin for now only will restart in MVU less than 150

## 2016-05-15 NOTE — H&P (Signed)
Lori Hanson is a 40 y.o. female presenting for elective induction of labor. Maternal Medical History:  Reason for admission: 39 weeks.  Multiparity.  Elective IOL.  Fetal activity: Perceived fetal activity is normal.   Last perceived fetal movement was within the past hour.    Prenatal complications: no prenatal complications Prenatal Complications - Diabetes: none.    OB History    Gravida Para Term Preterm AB TAB SAB Ectopic Multiple Living   6 3 3  2  1   3      Past Medical History  Diagnosis Date  . Hyperthyroidism    Past Surgical History  Procedure Laterality Date  . Thyroidectomy     Family History: She was adopted. Family history is unknown by patient. Social History:  reports that she has quit smoking. Her smoking use included Cigarettes. She smoked 0.00 packs per day. She does not have any smokeless tobacco history on file. She reports that she does not drink alcohol or use illicit drugs.   Prenatal Transfer Tool  Maternal Diabetes: No Genetic Screening: Normal Maternal Ultrasounds/Referrals: Normal Fetal Ultrasounds or other Referrals:  None Maternal Substance Abuse:  No Significant Maternal Medications:  None Significant Maternal Lab Results:  None Other Comments:  None  Review of Systems  All other systems reviewed and are negative.   Dilation: 1 Effacement (%): Thick Station: -3 Exam by:: J.Cox, RN Blood pressure 119/94, pulse 119, temperature 98.5 F (36.9 C), temperature source Oral, resp. rate 18, height 5\' 7"  (1.702 m), weight 230 lb (104.327 kg), last menstrual period 08/14/2015. Maternal Exam:  Abdomen: Fetal presentation: vertex  Introitus: Normal vulva. Normal vagina.  Pelvis: adequate for delivery.   Cervix: Cervix evaluated by digital exam.     Physical Exam  Nursing note and vitals reviewed. Constitutional: She is oriented to person, place, and time. She appears well-developed and well-nourished.  HENT:  Head: Normocephalic and  atraumatic.  Eyes: Conjunctivae are normal. Pupils are equal, round, and reactive to light.  Neck: Normal range of motion. Neck supple.  Cardiovascular: Normal rate and regular rhythm.   Respiratory: Effort normal and breath sounds normal.  GI: Soft. Bowel sounds are normal.  Genitourinary: Vagina normal and uterus normal.  Musculoskeletal: Normal range of motion.  Neurological: She is alert and oriented to person, place, and time.  Skin: Skin is warm and dry.  Psychiatric: She has a normal mood and affect. Her behavior is normal. Judgment and thought content normal.    Prenatal labs: ABO, Rh: --/--/O POS (06/13 0805) Antibody: NEG (06/13 0805) Rubella: 2.53 (12/14 1140) RPR: NON REAC (12/14 1140)  HBsAg: NEGATIVE (12/14 1140)  HIV: NONREACTIVE (12/14 1140)  GBS: Positive (05/17 1632)   Assessment/Plan: 39 weeks.  Multiparity.  2 stage IOL.   Grainger Mccarley A 05/15/2016, 11:03 AM

## 2016-05-15 NOTE — Progress Notes (Signed)
Lori Hanson is a 40 y.o. [id number] at 732w2d by LMP admitted for induction of labor due to Elective at term.  Subjective:   Objective: BP 101/54 mmHg  Pulse 92  Temp(Src) 98.6 F (37 C) (Oral)  Resp 18  Ht 5\' 7"  (1.702 m)  Wt 230 lb (104.327 kg)  BMI 36.01 kg/m2  SpO2 100%  LMP 08/14/2015      FHT:  FHR: 120 bpm, variability: moderate,  accelerations:  Present,  decelerations:  Present Patient had prolonged FHR decel, now FHR back up  to baseline 120's-130's UC:   regular, every 2-3 minutes SVE:   Dilation: 2 Effacement (%): 70 Station: -2 Exam by:: Gricel Copen  IUPC placed  Labs: Lab Results  Component Value Date   WBC 16.2* 05/15/2016   HGB 12.2 05/15/2016   HCT 37.4 05/15/2016   MCV 91.2 05/15/2016   PLT 377 05/15/2016    Assessment / Plan: 39 weeks.  Multiparity.  Elective IOL.  S/P prolonged FHR decel, now stable baseline with no decels and reassuring pattern.  Continue close monitoring and restart pitocin prn for goal of 180 - 240 MV units toco  Labor: Early Preeclampsia:  n/a Fetal Wellbeing:  Category I Pain Control:  Epidural I/D:  n/a Anticipated MOD:  NSVD  Reece Fehnel A 05/15/2016, 4:51 PM

## 2016-05-15 NOTE — Anesthesia Procedure Notes (Signed)
Epidural Patient location during procedure: OB Start time: 05/15/2016 12:37 PM  Staffing Anesthesiologist: Mal AmabileFOSTER, Dell Briner Performed by: anesthesiologist   Preanesthetic Checklist Completed: patient identified, site marked, surgical consent, pre-op evaluation, timeout performed, IV checked, risks and benefits discussed and monitors and equipment checked  Epidural Patient position: sitting Prep: site prepped and draped and DuraPrep Patient monitoring: continuous pulse ox and blood pressure Approach: midline Location: L3-L4 Injection technique: LOR air  Needle:  Needle type: Tuohy  Needle gauge: 17 G Needle length: 9 cm and 9 Needle insertion depth: 6 cm Catheter type: closed end flexible Catheter size: 19 Gauge Catheter at skin depth: 12 cm Test dose: negative and Other  Assessment Events: blood not aspirated, injection not painful, no injection resistance, negative IV test and no paresthesia  Additional Notes Patient identified. Risks and benefits discussed including failed block, incomplete  Pain control, post dural puncture headache, nerve damage, paralysis, blood pressure Changes, nausea, vomiting, reactions to medications-both toxic and allergic and post Partum back pain. All questions were answered. Patient expressed understanding and wished to proceed. Sterile technique was used throughout procedure. Epidural site was Dressed with sterile barrier dressing. No paresthesias, signs of intravascular injection Or signs of intrathecal spread were encountered.  Patient was more comfortable after the epidural was dosed. Please see RN's note for documentation of vital signs and FHR which are stable.

## 2016-05-15 NOTE — Anesthesia Preprocedure Evaluation (Addendum)
Anesthesia Evaluation  Patient identified by MRN, date of birth, ID band Patient awake    Reviewed: Allergy & Precautions, NPO status , Patient's Chart, lab work & pertinent test results  Airway Mallampati: III  TM Distance: >3 FB Neck ROM: Full    Dental no notable dental hx. (+) Teeth Intact   Pulmonary former smoker,    Pulmonary exam normal breath sounds clear to auscultation       Cardiovascular negative cardio ROS Normal cardiovascular exam Rhythm:Regular Rate:Normal     Neuro/Psych negative neurological ROS  negative psych ROS   GI/Hepatic negative GI ROS, Neg liver ROS,   Endo/Other  Hypothyroidism Hyperthyroidism Obesity S/P Thyroidectomy  Renal/GU negative Renal ROS  negative genitourinary   Musculoskeletal negative musculoskeletal ROS (+)   Abdominal (+) + obese,   Peds  Hematology negative hematology ROS (+)   Anesthesia Other Findings   Reproductive/Obstetrics (+) Pregnancy                            Anesthesia Physical Anesthesia Plan  ASA: II  Anesthesia Plan: Epidural   Post-op Pain Management:    Induction:   Airway Management Planned: Natural Airway  Additional Equipment:   Intra-op Plan:   Post-operative Plan:   Informed Consent: I have reviewed the patients History and Physical, chart, labs and discussed the procedure including the risks, benefits and alternatives for the proposed anesthesia with the patient or authorized representative who has indicated his/her understanding and acceptance.     Plan Discussed with: Anesthesiologist  Anesthesia Plan Comments:         Anesthesia Quick Evaluation

## 2016-05-16 LAB — CBC
HCT: 33.4 % — ABNORMAL LOW (ref 36.0–46.0)
HEMOGLOBIN: 10.8 g/dL — AB (ref 12.0–15.0)
MCH: 29.3 pg (ref 26.0–34.0)
MCHC: 32.3 g/dL (ref 30.0–36.0)
MCV: 90.8 fL (ref 78.0–100.0)
PLATELETS: 286 10*3/uL (ref 150–400)
RBC: 3.68 MIL/uL — AB (ref 3.87–5.11)
RDW: 15.1 % (ref 11.5–15.5)
WBC: 15.3 10*3/uL — AB (ref 4.0–10.5)

## 2016-05-16 MED ORDER — POLYSACCHARIDE IRON COMPLEX 150 MG PO CAPS
150.0000 mg | ORAL_CAPSULE | Freq: Every day | ORAL | Status: DC
  Administered 2016-05-16: 150 mg via ORAL
  Filled 2016-05-16 (×2): qty 1

## 2016-05-16 NOTE — Progress Notes (Signed)
Subjective:    Lori Hanson is a 40 y.o. female being seen today for her obstetrical visit. She is at 165w2d gestation. Patient reports backache and occasional contractions. Fetal movement: normal.  Problem List Items Addressed This Visit    None    Visit Diagnoses    Prenatal care, third trimester    -  Primary    Relevant Orders    POCT urinalysis dipstick (Completed)    Culture, OB Urine (Completed)      Patient Active Problem List   Diagnosis Date Noted  . Indication for care in labor or delivery 05/15/2016  . NSVD (normal spontaneous vaginal delivery) 05/15/2016    Objective:    BP 134/83 mmHg  Pulse 96  Wt 230 lb (104.327 kg)  LMP 08/14/2015 FHT: 150 BPM  Uterine Size: size equals dates  Presentations: cephalic  Pelvic Exam:              Dilation: 1cm       Effacement: 50%             Station:  -3    Consistency: soft            Position: posterior     Assessment:    Pregnancy @ 3565w2d weeks   Plan:   Plans for delivery: Vaginal anticipated; labs reviewed; problem list updated Counseling: Consent signed. Infant feeding: plans to breastfeed. Cigarette smoking: former smoker. L&D discussion: symptoms of labor, discussed when to call, discussed what number to call, anesthetic/analgesic options reviewed and delivering clinician:  plans former smoker. Postpartum supports and preparation: circumcision discussed and contraception plans discussed.  Follow up in 1 Week.

## 2016-05-16 NOTE — Lactation Note (Signed)
This note was copied from a baby's chart. Lactation Consultation Note Mom is going to formula feed and not breast feed.  Patient Name: Lori Hanson NGEXB'MToday's Date: 05/16/2016     Maternal Data    Feeding    LATCH Score/Interventions                      Lactation Tools Discussed/Used     Consult Status      Dejion Grillo, Diamond NickelLAURA G 05/16/2016, 5:02 AM

## 2016-05-16 NOTE — Progress Notes (Signed)
Post Partum Day #1 Subjective: no complaints, up ad lib, voiding and tolerating PO  Objective: Blood pressure 118/65, pulse 84, temperature 98.7 F (37.1 C), temperature source Oral, resp. rate 18, height 5\' 7"  (1.702 m), weight 230 lb (104.327 kg), last menstrual period 08/14/2015, SpO2 97 %, unknown if currently breastfeeding.  Physical Exam:  General: alert, cooperative and no distress Lochia: appropriate Uterine Fundus: firm Incision: none DVT Evaluation: No evidence of DVT seen on physical exam. No cords or calf tenderness. No significant calf/ankle edema.   Recent Labs  05/15/16 0805 05/16/16 0545  HGB 12.2 10.8*  HCT 37.4 33.4*    Assessment/Plan: Plan for discharge tomorrow, Breastfeeding and Lactation consult. Anemia: iron started.    LOS: 1 day   Roe CoombsRachelle A Vlasta Baskin 05/16/2016, 8:19 AM

## 2016-05-16 NOTE — Progress Notes (Signed)
UR chart review completed.  

## 2016-05-17 MED ORDER — OXYCODONE-ACETAMINOPHEN 5-325 MG PO TABS: 1.0000 | ORAL_TABLET | ORAL | Status: AC | PRN

## 2016-05-17 MED ORDER — SENNOSIDES-DOCUSATE SODIUM 8.6-50 MG PO TABS: 2.0000 | ORAL_TABLET | ORAL | Status: AC

## 2016-05-17 MED ORDER — POLYSACCHARIDE IRON COMPLEX 150 MG PO CAPS: 150.0000 mg | ORAL_CAPSULE | Freq: Every day | ORAL | Status: AC

## 2016-05-17 MED ORDER — IBUPROFEN 600 MG PO TABS: 600.0000 mg | ORAL_TABLET | Freq: Four times a day (QID) | ORAL | Status: AC

## 2016-05-17 MED ORDER — BENZOCAINE-MENTHOL 20-0.5 % EX AERO: 1.0000 "application " | INHALATION_SPRAY | Freq: Four times a day (QID) | CUTANEOUS | Status: AC | PRN

## 2016-05-17 MED ORDER — COCONUT OIL OIL: 1.0000 "application " | TOPICAL_OIL | Status: AC | PRN

## 2016-05-17 NOTE — Discharge Summary (Signed)
Obstetric Discharge Summary Reason for Admission: induction of labor and elective at term Prenatal Procedures: ultrasound Intrapartum Procedures: spontaneous vaginal delivery Postpartum Procedures: none Complications-Operative and Postpartum: 2nd, sulkas degree perineal laceration HEMOGLOBIN  Date Value Ref Range Status  05/16/2016 10.8* 12.0 - 15.0 g/dL Final   HCT  Date Value Ref Range Status  05/16/2016 33.4* 36.0 - 46.0 % Final    Physical Exam:  General: alert, cooperative and no distress Lochia: appropriate Uterine Fundus: firm Incision: no significant drainage DVT Evaluation: No evidence of DVT seen on physical exam. No cords or calf tenderness. No significant calf/ankle edema.  Discharge Diagnoses: Term Pregnancy-delivered  Discharge Information: Date: 05/17/2016 Activity: pelvic rest Diet: routine Medications: PNV, Ibuprofen, Colace, Iron and Percocet Condition: stable Instructions: refer to practice specific booklet Discharge to: home Follow-up Information    [facility] Schedule an appointment as soon as possible for a visit in 2 weeks.   Why:  postpartum exam   Contact information:   [address] [place] [id number] [phone]      Newborn Data: Live born female  Birth Weight: 7 lb 7.8 oz (3395 g) APGAR: 8, 9  Home with mother.  Roe Coombsachelle A Denney, CNM 05/17/2016, 8:17 AM

## 2016-05-21 NOTE — Anesthesia Postprocedure Evaluation (Signed)
Anesthesia Post Note  Patient: Lori NgMichele S Pistole  Procedure(s) Performed: * No procedures listed *  Anesthesia Type: Epidural Anesthetic complications: no     Last Vitals:  Filed Vitals:   05/16/16 1755 05/17/16 0610  BP: 129/87 124/72  Pulse: 86 84  Temp: 36.6 C 36.4 C  Resp: 18 18    Last Pain:  Filed Vitals:   05/17/16 0918  PainSc: 3    Pain Goal: Patients Stated Pain Goal: 2 (05/17/16 0610)               Darold Miley A.

## 2016-05-29 ENCOUNTER — Ambulatory Visit: Payer: Medicaid Other | Admitting: Obstetrics

## 2016-06-01 ENCOUNTER — Ambulatory Visit: Payer: Medicaid Other | Admitting: Obstetrics

## 2016-06-19 ENCOUNTER — Ambulatory Visit: Payer: Medicaid Other | Admitting: Obstetrics

## 2016-08-15 ENCOUNTER — Ambulatory Visit (INDEPENDENT_AMBULATORY_CARE_PROVIDER_SITE_OTHER): Payer: Medicaid Other | Admitting: Obstetrics

## 2016-08-15 DIAGNOSIS — Z3009 Encounter for other general counseling and advice on contraception: Secondary | ICD-10-CM | POA: Diagnosis not present

## 2016-08-15 DIAGNOSIS — Z30011 Encounter for initial prescription of contraceptive pills: Secondary | ICD-10-CM

## 2016-08-15 MED ORDER — NORETHIN ACE-ETH ESTRAD-FE 1-20 MG-MCG(24) PO TABS: 1.0000 | ORAL_TABLET | Freq: Every day | ORAL | 11 refills | Status: DC

## 2016-08-15 NOTE — Progress Notes (Signed)
Subjective:     Lori Hanson is a 40 y.o. female who presents for a postpartum visit. She is 3 months postpartum following a spontaneous vaginal delivery. I have fully reviewed the prenatal and intrapartum course. The delivery was at 39 gestational weeks. Outcome: spontaneous vaginal delivery. Anesthesia: epidural. Postpartum course has been normal. Baby's course has been normal. Baby is feeding by bottle - Similac Advance. Bleeding no bleeding. Bowel function is normal. Bladder function is normal. Patient is sexually active. Contraception method is condoms. Postpartum depression screening: negative.  Tobacco, alcohol and substance abuse history reviewed.  Adult immunizations reviewed including TDAP, rubella and varicella.  The following portions of the patient's history were reviewed and updated as appropriate: allergies, current medications, past family history, past medical history, past social history, past surgical history and problem list.  Review of Systems A comprehensive review of systems was negative.   Objective:    BP 115/81   Pulse 88   Temp 99.1 F (37.3 C) (Oral)   Wt 223 lb 4.8 oz (101.3 kg)   Breastfeeding? No   BMI 34.97 kg/m    PE:  Deferred   50% of 15 min visit spent on counseling and coordination of care.   Assessment:    Doing well.  3 months postpartum  Contraceptive counseling and advice.  Wants OCP's.  Plan:    1. Contraception: OCP (estrogen/progesterone) 2. Loestrin 24 Fe Rx 3. Follow up in: 4 months or as needed.   Healthy lifestyle practices reviewed

## 2016-08-16 ENCOUNTER — Encounter: Payer: Self-pay | Admitting: Obstetrics

## 2017-05-01 IMAGING — DX DG ANKLE COMPLETE 3+V*L*
3 series · 3 of 3 positions shown · non-contrast
Comparison: None.

CLINICAL DATA: Initial valuation for acute left ankle pain. Fall 1
month ago.

EXAM:
LEFT ANKLE COMPLETE - 3+ VIEW

[ankle ap]
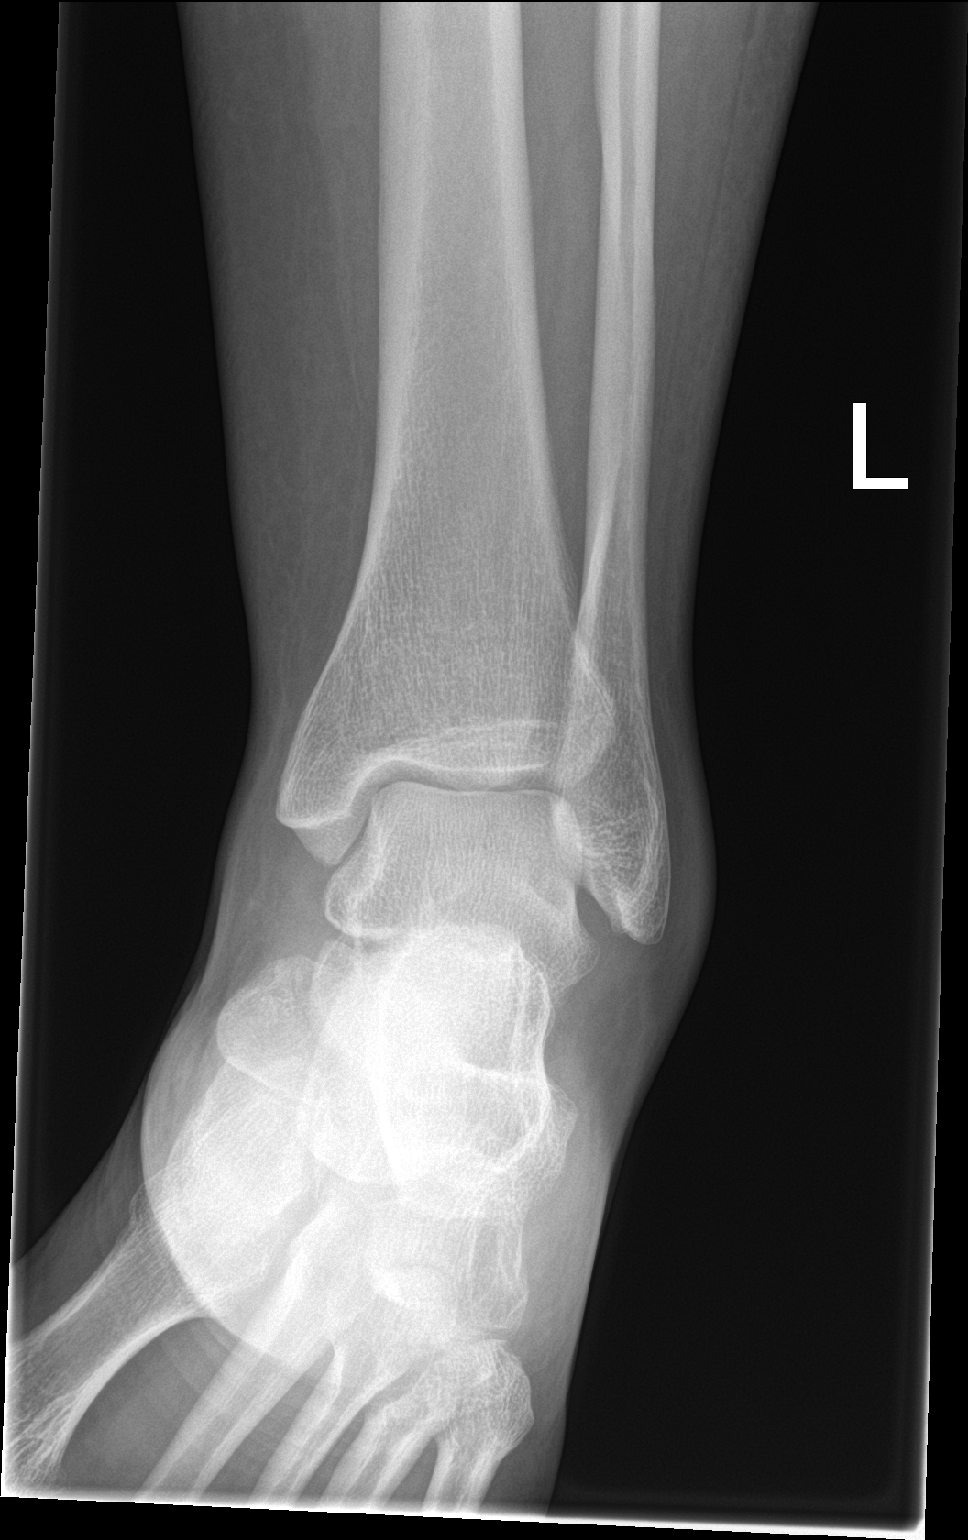

[ankle obl]
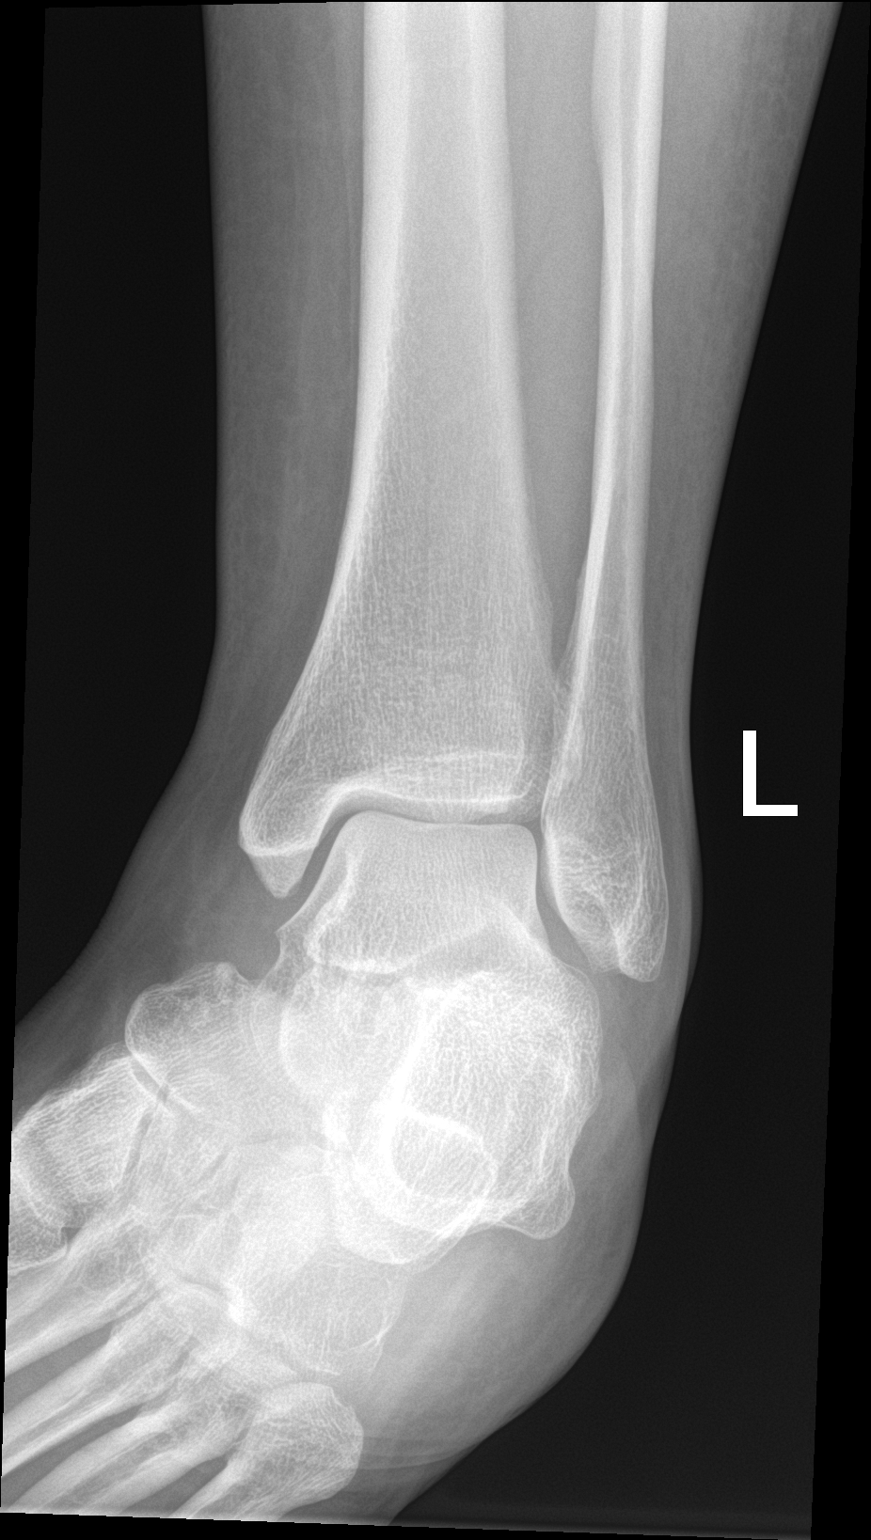

[ankle lat]
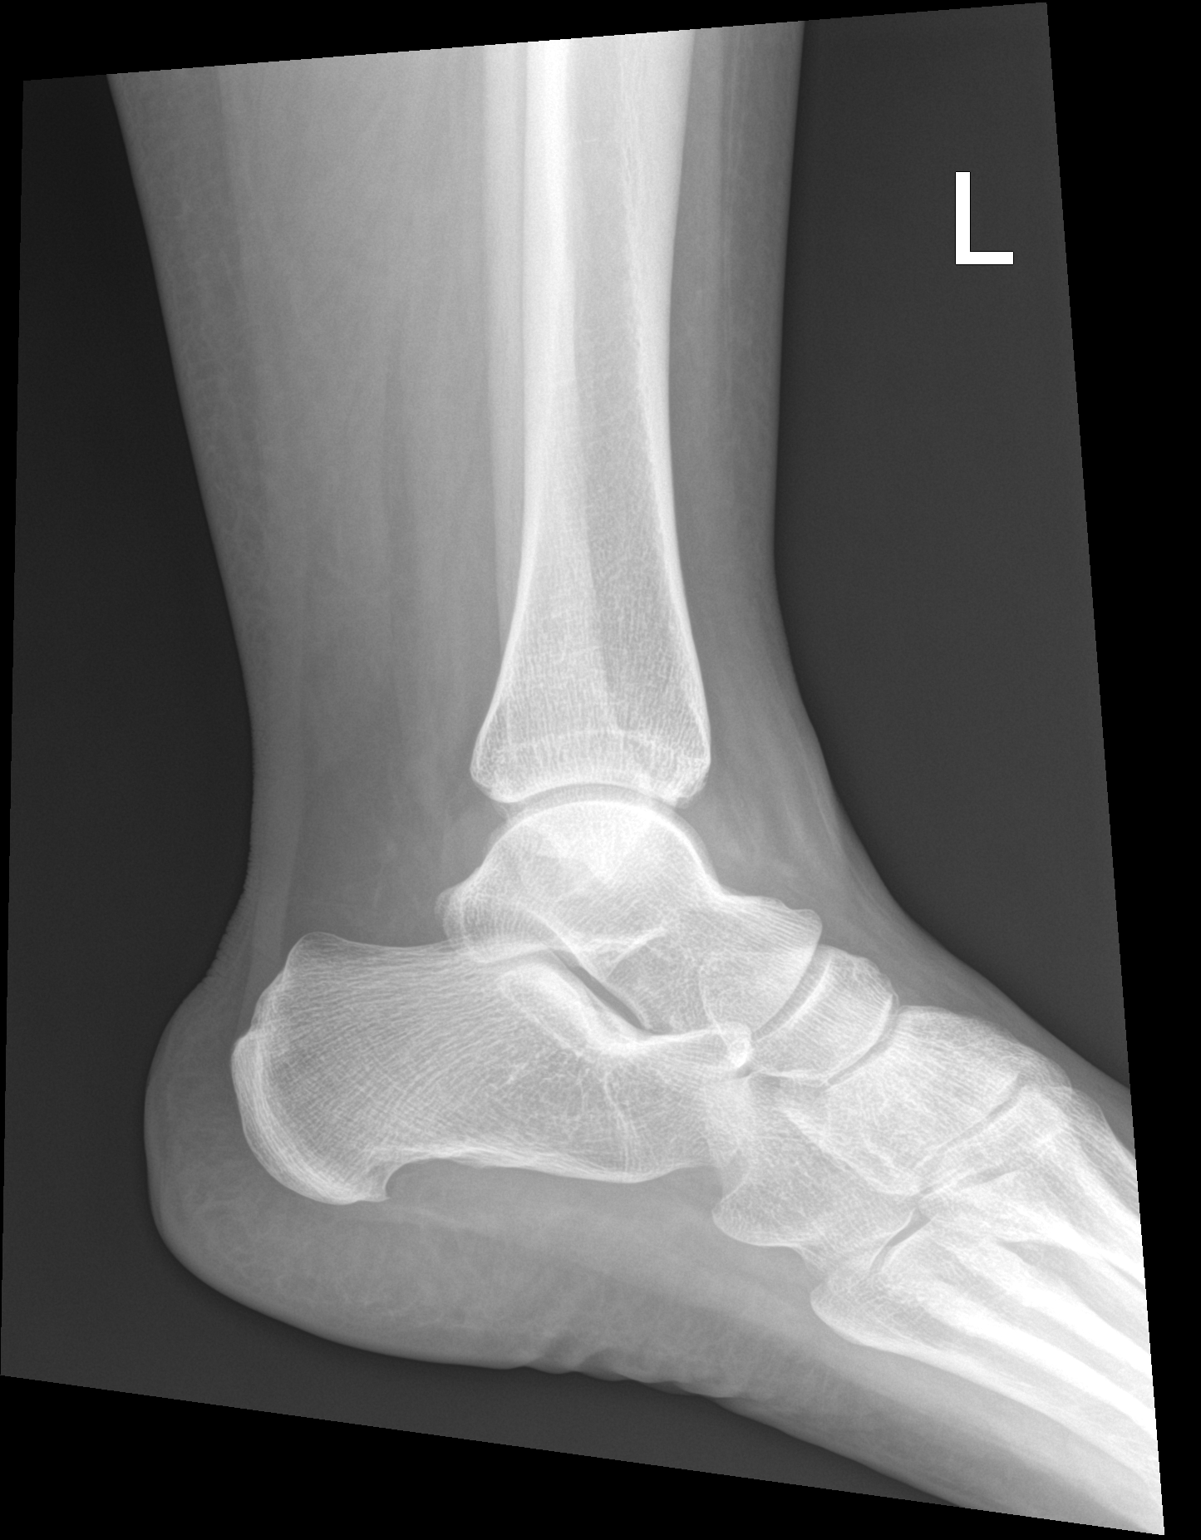

[3 of 3 positions shown; findings below may reference images not displayed]

FINDINGS: No acute fracture or dislocation. Talar dome intact. No joint
effusion. Ankle mortise approximated. No acute soft tissue
abnormality.

Small degenerative plantar calcaneal enthesophyte noted.
IMPRESSION: No acute abnormality identified about the left ankle.

## 2017-08-17 ENCOUNTER — Other Ambulatory Visit: Payer: Self-pay | Admitting: Obstetrics

## 2017-08-17 DIAGNOSIS — Z30011 Encounter for initial prescription of contraceptive pills: Secondary | ICD-10-CM

## 2017-08-21 ENCOUNTER — Other Ambulatory Visit: Payer: Self-pay | Admitting: *Deleted

## 2017-08-21 DIAGNOSIS — Z30011 Encounter for initial prescription of contraceptive pills: Secondary | ICD-10-CM

## 2017-08-21 MED ORDER — NORETHIN ACE-ETH ESTRAD-FE 1-20 MG-MCG(24) PO TABS: 1.0000 | ORAL_TABLET | Freq: Every day | ORAL | 2 refills | Status: AC

## 2018-01-24 IMAGING — US US MFM OB FOLLOW-UP
1 series · 14 of 28 positions shown · non-contrast
Comparison: none

[Series 1: us mfm ob follow-up · 50 acquisitions, 14 frames shown]
[im 2/50]
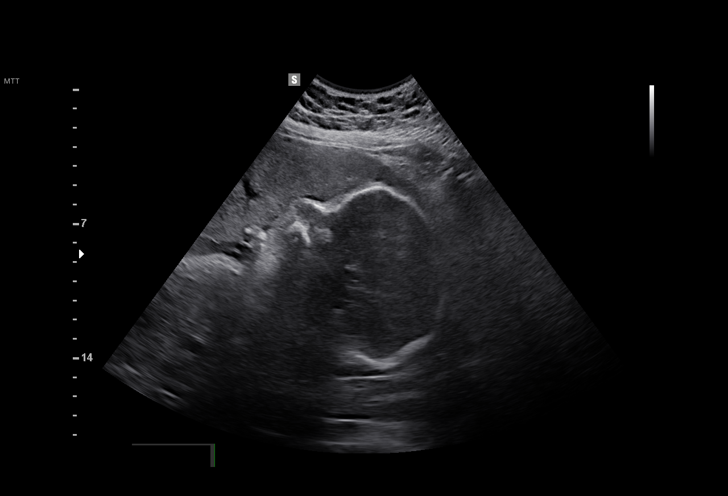
[im 6/50]
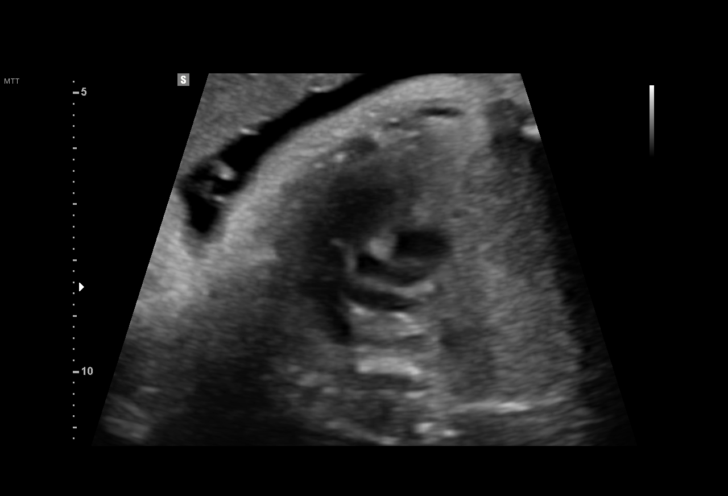
[im 10/50]
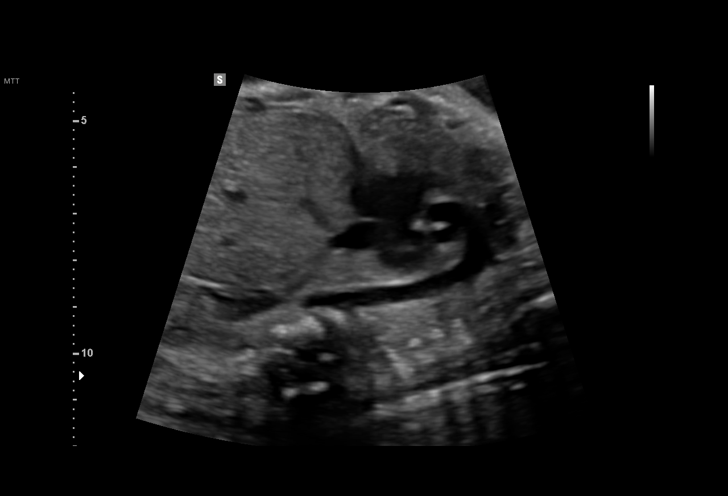
[im 13/50]
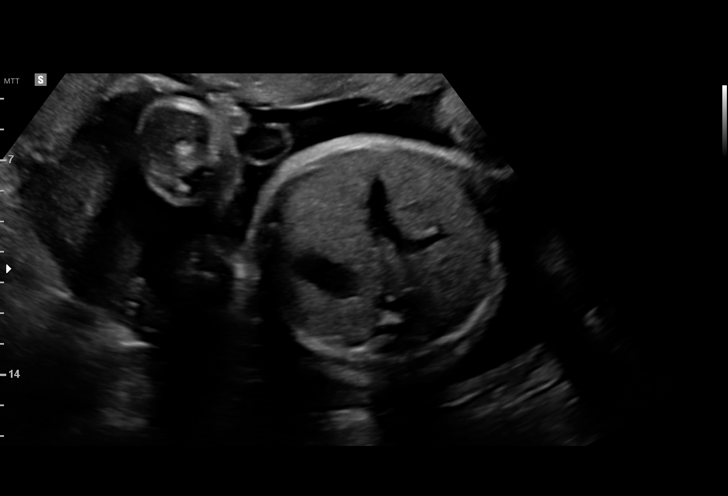
[im 17/50]
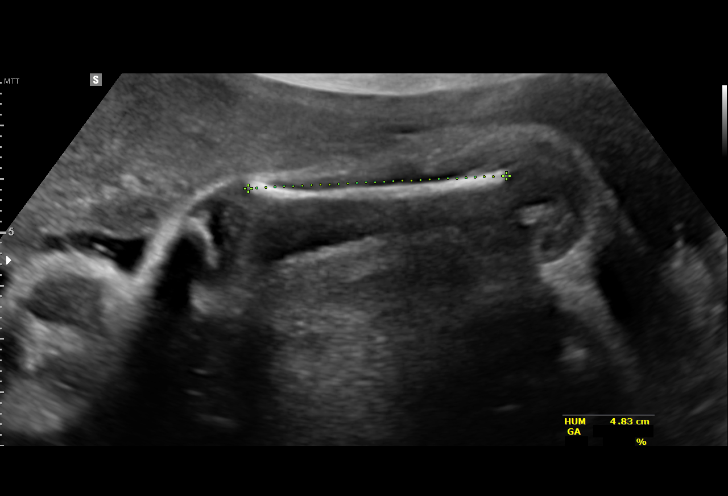
[im 20/50]
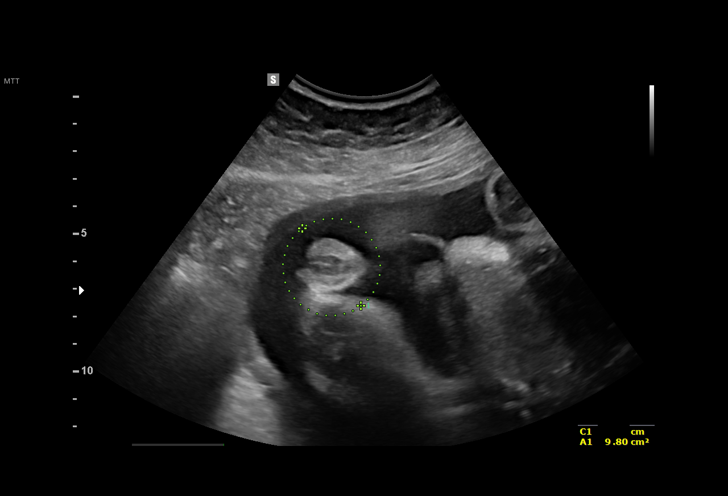
[im 24/50]
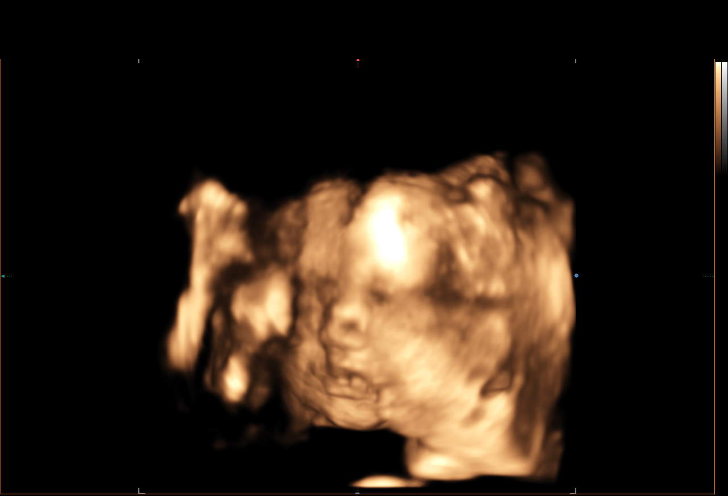
[im 28/50]
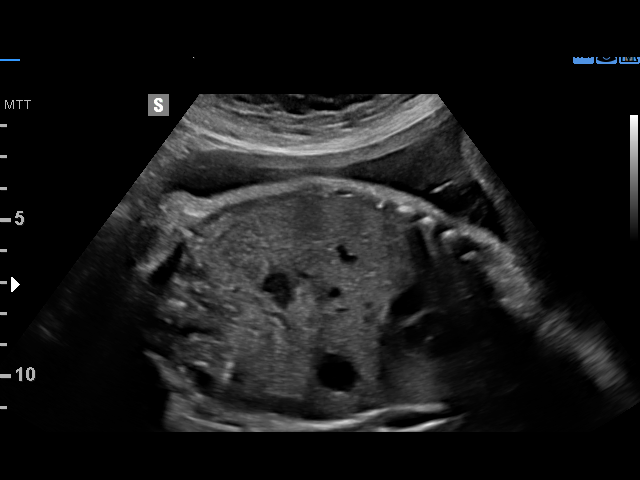
[im 31/50]
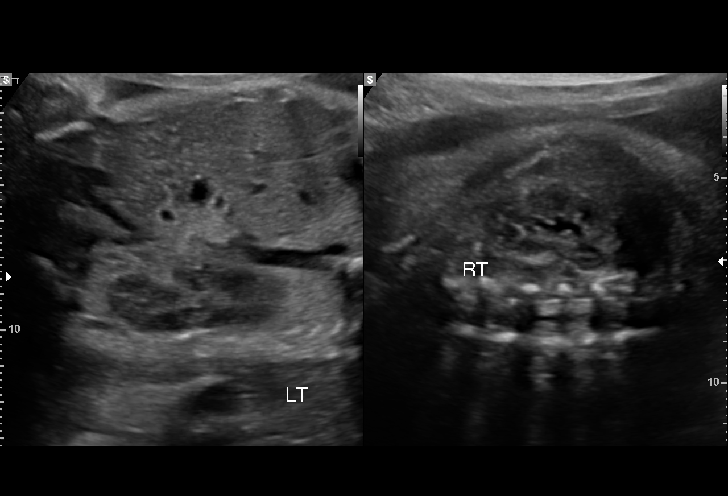
[im 35/50]
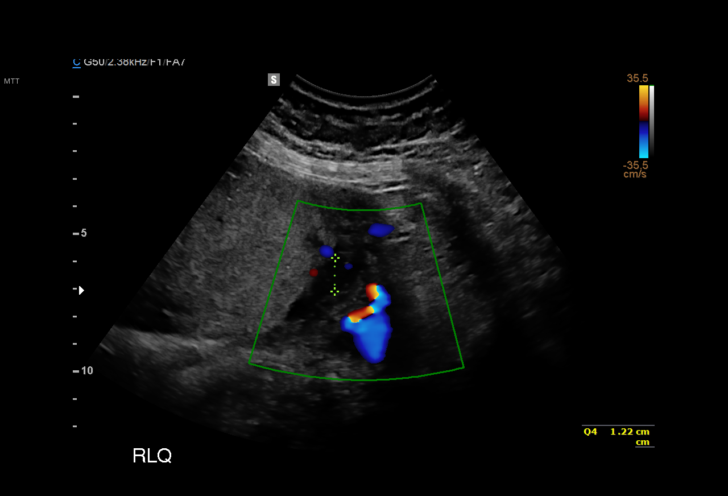
[im 39/50]
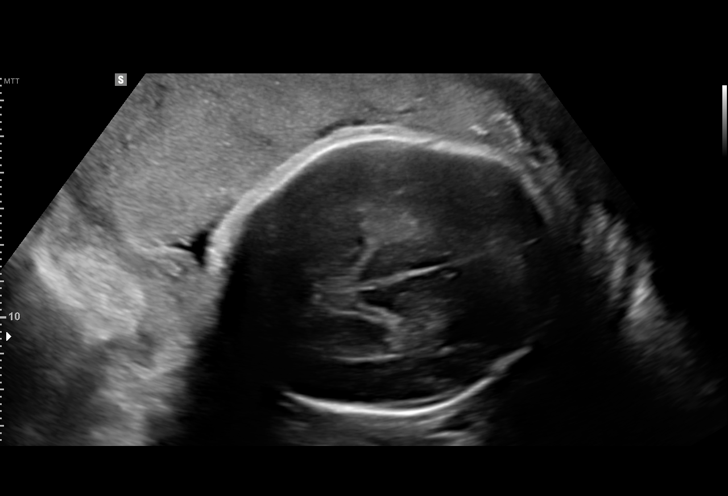
[im 42/50]
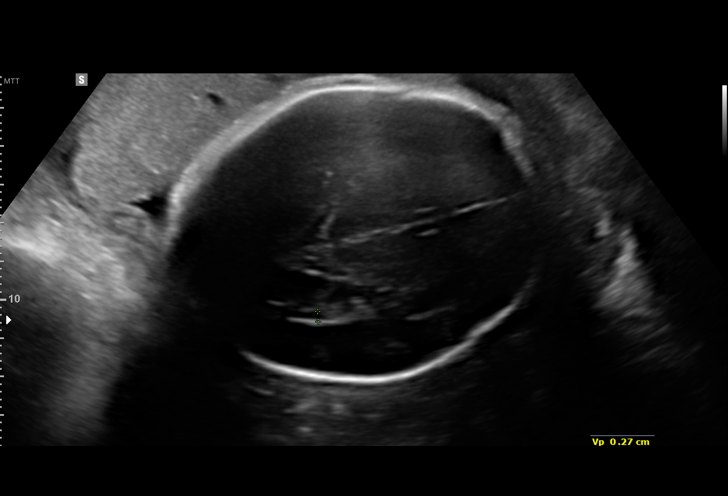
[im 46/50]
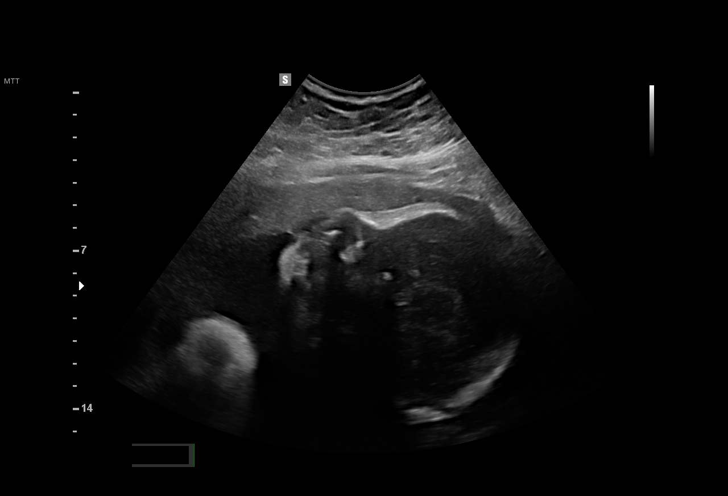
[im 50/50]
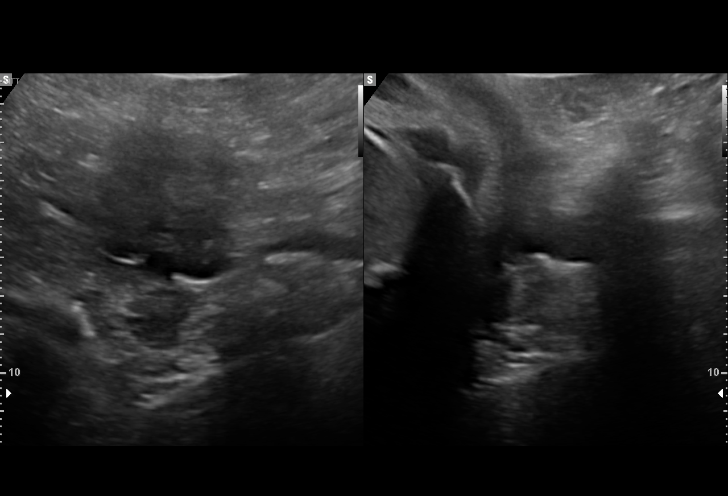

[14 of 28 positions shown; findings below may reference images not displayed]

1  GAURI CASIQUE           [id number]      [id number]     [id number]
Indications

30 weeks gestation of pregnancy
Advanced maternal age multigravida 35+,
second trimester; declined testing
H/O hyperthyroidism; s/p thyroidectomy; on
replacement tx (Synthroid) x 9 years
Obesity complicating pregnancy, second
trimester
Uterine size-date discrepancy, third trimester
OB History

Height:       5'7"    Weight:   220       BMI:
Gravidity:    6         Term:   3         SAB:   2
Living:       3
Fetal Evaluation

Num Of Fetuses:     1
Fetal Heart         135
Rate(bpm):
Cardiac Activity:   Observed
Presentation:       Cephalic
Placenta:           Anterior, above cervical os
P. Cord Insertion:  Previously Visualized

Amniotic Fluid
AFI FV:      Subjectively within normal limits
AFI Sum:     16.98   cm      62   %Tile     Larg Pckt:   6.05   cm
RUQ:   6.05   cm    RLQ:    1.22   cm    LUQ:   4.81    cm   LLQ:    4.9    cm
Biometry
BPD:      76.4  mm     G. Age:  30w 5d                  CI:        75.75   %   70 - 86
FL/HC:      20.1   %   19.3 -
HC:      278.3  mm     G. Age:  30w 3d         12  %    HC/AC:      1.03       0.96 -
AC:      270.8  mm     G. Age:  31w 1d         60  %    FL/BPD:     73.2   %   71 - 87
FL:       55.9  mm     G. Age:  29w 3d          9  %    FL/AC:      20.6   %   20 - 24
HUM:      48.8  mm     G. Age:  28w 5d          9  %
CER:      41.1  mm     G. Age:  35w 1d       > 95  %
Est. FW:    1280  gm      3 lb 8 oz     51  %
Gestational Age

LMP:           30w 5d       Date:   08/14/15                 EDD:   05/20/16
U/S Today:     30w 3d                                        EDD:   05/22/16
Best:          30w 5d    Det. By:   LMP  (08/14/15)          EDD:   05/20/16
Anatomy

Cranium:          Appears normal         Aortic Arch:      Appears normal
Fetal Cavum:      Appears normal         Ductal Arch:      Appears normal
Ventricles:       Appears normal         Diaphragm:        Appears normal
Choroid Plexus:   Previously seen        Stomach:          Appears normal, left
sided
Cerebellum:       Appears normal         Abdomen:          Previously seen
Posterior Fossa:  Previously seen        Abdominal Wall:   Previously seen
Nuchal Fold:      Previously seen        Cord Vessels:     Previously seen
Face:             Orbits and profile     Kidneys:          Appear normal
previously seen
Lips:             Previously seen        Bladder:          Appears normal
Fetal Thoracic:   Previously seen        Spine:            Previously seen
Heart:            Appears normal         Upper             Previously seen
(4CH, axis, and        Extremities:
situs)
RVOT:             Appears normal         Lower             Previously seen
Extremities:
LVOT:             Appears normal

Other:  Female gender. Heels visualized previously. Technically difficult due
to maternal habitus and fetal position.
Cervix Uterus Adnexa

Cervix
Not visualized (advanced GA >94wks)

Uterus
No abnormality visualized.

Left Ovary
Size(cm)     2.02  x    2.08   x  1.52      Vol(ml):
Within normal limits. No adnexal mass visualized.

Right Ovary
Size(cm)     2.88  x    1.87   x  1.36      Vol(ml):
Within normal limits. No adnexal mass visualized.

Cul De Sac:   No free fluid seen.

Adnexa:       No abnormality visualized.
Impression

SIUP at 30+5 weeks
Normal interval anatomy; anatomic survey complete
Normal amniotic fluid volume
Appropriate interval growth with EFW at the 51st %tile
Recommendations

Follow-up as clinically indicated

## 2018-02-24 ENCOUNTER — Ambulatory Visit: Payer: Self-pay | Admitting: Obstetrics

## 2018-03-05 ENCOUNTER — Ambulatory Visit: Payer: Self-pay | Admitting: Obstetrics

## 2018-07-27 ENCOUNTER — Other Ambulatory Visit: Payer: Self-pay | Admitting: Obstetrics

## 2018-07-27 DIAGNOSIS — Z30011 Encounter for initial prescription of contraceptive pills: Secondary | ICD-10-CM

## 2019-06-25 ENCOUNTER — Other Ambulatory Visit: Payer: Self-pay

## 2019-06-25 DIAGNOSIS — Z20822 Contact with and (suspected) exposure to covid-19: Secondary | ICD-10-CM

## 2019-06-28 LAB — NOVEL CORONAVIRUS, NAA: SARS-CoV-2, NAA: NOT DETECTED

## 2021-04-27 ENCOUNTER — Ambulatory Visit: Payer: Medicaid Other | Admitting: Physical Therapy

## 2021-05-02 ENCOUNTER — Ambulatory Visit: Payer: Medicaid Other | Attending: Neurosurgery

## 2022-01-23 ENCOUNTER — Institutional Professional Consult (permissible substitution): Payer: Medicaid Other | Admitting: Pulmonary Disease

## 2024-07-29 ENCOUNTER — Other Ambulatory Visit: Payer: Self-pay | Admitting: Medical Genetics

## 2024-10-04 ENCOUNTER — Other Ambulatory Visit: Payer: Self-pay | Admitting: Medical Genetics

## 2024-10-04 DIAGNOSIS — Z006 Encounter for examination for normal comparison and control in clinical research program: Secondary | ICD-10-CM
